# Patient Record
Sex: Female | Born: 1960 | Race: White | Hispanic: No | Marital: Single | Smoking: Never smoker
Health system: Southern US, Community
[De-identification: ages and names within clinical notes are randomized; demographics above are authoritative.]

## PROBLEM LIST (undated history)

## (undated) DIAGNOSIS — M1990 Unspecified osteoarthritis, unspecified site: Secondary | ICD-10-CM

## (undated) DIAGNOSIS — I10 Essential (primary) hypertension: Secondary | ICD-10-CM

## (undated) HISTORY — PX: ANKLE SURGERY: SHX546

## (undated) HISTORY — PX: WISDOM TOOTH EXTRACTION: SHX21

## (undated) HISTORY — PX: SPINAL FUSION: SHX223

## (undated) HISTORY — PX: JOINT REPLACEMENT: SHX530

## (undated) HISTORY — PX: BACK SURGERY: SHX140

## (undated) HISTORY — PX: ABDOMINAL HYSTERECTOMY: SHX81

## (undated) HISTORY — PX: TOTAL HIP ARTHROPLASTY: SHX124

## (undated) HISTORY — PX: STRABISMUS SURGERY: SHX218

---

## 2018-10-03 DIAGNOSIS — Z20828 Contact with and (suspected) exposure to other viral communicable diseases: Secondary | ICD-10-CM | POA: Diagnosis not present

## 2019-06-09 ENCOUNTER — Inpatient Hospital Stay (HOSPITAL_COMMUNITY)
Admission: EM | Admit: 2019-06-09 | Discharge: 2019-06-13 | DRG: 177 | Disposition: A | Discharge status: Home / Self Care | Payer: BC Managed Care – PPO | Attending: Internal Medicine | Admitting: Internal Medicine

## 2019-06-09 ENCOUNTER — Emergency Department (HOSPITAL_COMMUNITY): Payer: BC Managed Care – PPO

## 2019-06-09 ENCOUNTER — Other Ambulatory Visit: Payer: Self-pay

## 2019-06-09 ENCOUNTER — Encounter (HOSPITAL_COMMUNITY): Payer: Self-pay | Admitting: Emergency Medicine

## 2019-06-09 DIAGNOSIS — Z885 Allergy status to narcotic agent status: Secondary | ICD-10-CM

## 2019-06-09 DIAGNOSIS — R05 Cough: Secondary | ICD-10-CM

## 2019-06-09 DIAGNOSIS — Z6841 Body Mass Index (BMI) 40.0 and over, adult: Secondary | ICD-10-CM

## 2019-06-09 DIAGNOSIS — E875 Hyperkalemia: Secondary | ICD-10-CM | POA: Diagnosis not present

## 2019-06-09 DIAGNOSIS — R030 Elevated blood-pressure reading, without diagnosis of hypertension: Secondary | ICD-10-CM | POA: Diagnosis not present

## 2019-06-09 DIAGNOSIS — J9601 Acute respiratory failure with hypoxia: Secondary | ICD-10-CM | POA: Diagnosis not present

## 2019-06-09 DIAGNOSIS — Z96643 Presence of artificial hip joint, bilateral: Secondary | ICD-10-CM | POA: Diagnosis not present

## 2019-06-09 DIAGNOSIS — U071 COVID-19: Principal | ICD-10-CM | POA: Diagnosis present

## 2019-06-09 DIAGNOSIS — R7982 Elevated C-reactive protein (CRP): Secondary | ICD-10-CM | POA: Diagnosis not present

## 2019-06-09 DIAGNOSIS — Z833 Family history of diabetes mellitus: Secondary | ICD-10-CM | POA: Diagnosis not present

## 2019-06-09 DIAGNOSIS — Z9981 Dependence on supplemental oxygen: Secondary | ICD-10-CM | POA: Diagnosis not present

## 2019-06-09 DIAGNOSIS — Z03818 Encounter for observation for suspected exposure to other biological agents ruled out: Secondary | ICD-10-CM | POA: Diagnosis not present

## 2019-06-09 DIAGNOSIS — Z20828 Contact with and (suspected) exposure to other viral communicable diseases: Secondary | ICD-10-CM | POA: Diagnosis not present

## 2019-06-09 DIAGNOSIS — R059 Cough, unspecified: Secondary | ICD-10-CM

## 2019-06-09 DIAGNOSIS — M1990 Unspecified osteoarthritis, unspecified site: Secondary | ICD-10-CM | POA: Diagnosis not present

## 2019-06-09 DIAGNOSIS — R0902 Hypoxemia: Secondary | ICD-10-CM

## 2019-06-09 DIAGNOSIS — Z8249 Family history of ischemic heart disease and other diseases of the circulatory system: Secondary | ICD-10-CM

## 2019-06-09 HISTORY — DX: Unspecified osteoarthritis, unspecified site: M19.90

## 2019-06-09 LAB — COMPREHENSIVE METABOLIC PANEL
ALT: 38 U/L (ref 0–44)
AST: 48 U/L — ABNORMAL HIGH (ref 15–41)
Albumin: 3.4 g/dL — ABNORMAL LOW (ref 3.5–5.0)
Alkaline Phosphatase: 31 U/L — ABNORMAL LOW (ref 38–126)
Anion gap: 6 (ref 5–15)
BUN: 14 mg/dL (ref 6–20)
CO2: 28 mmol/L (ref 22–32)
Calcium: 8.4 mg/dL — ABNORMAL LOW (ref 8.9–10.3)
Chloride: 103 mmol/L (ref 98–111)
Creatinine, Ser: 0.54 mg/dL (ref 0.44–1.00)
GFR calc Af Amer: >60 mL/min (ref >60)
GFR calc non Af Amer: >60 mL/min (ref >60)
Glucose, Bld: 117 mg/dL — ABNORMAL HIGH (ref 70–99)
Potassium: 3.7 mmol/L (ref 3.5–5.1)
Sodium: 137 mmol/L (ref 135–145)
Total Bilirubin: 0.4 mg/dL (ref 0.3–1.2)
Total Protein: 6.5 g/dL (ref 6.5–8.1)

## 2019-06-09 LAB — CBC WITH DIFFERENTIAL/PLATELET
Abs Immature Granulocytes: 0.03 10*3/uL (ref 0.00–0.07)
Basophils Absolute: 0 10*3/uL (ref 0.0–0.1)
Basophils Relative: 0 %
Eosinophils Absolute: 0 10*3/uL (ref 0.0–0.5)
Eosinophils Relative: 0 %
HCT: 38.9 % (ref 36.0–46.0)
Hemoglobin: 12.2 g/dL (ref 12.0–15.0)
Immature Granulocytes: 1 %
Lymphocytes Relative: 16 %
Lymphs Abs: 1 10*3/uL (ref 0.7–4.0)
MCH: 28.8 pg (ref 26.0–34.0)
MCHC: 31.4 g/dL (ref 30.0–36.0)
MCV: 91.7 fL (ref 80.0–100.0)
Monocytes Absolute: 0.3 10*3/uL (ref 0.1–1.0)
Monocytes Relative: 5 %
Neutro Abs: 4.8 10*3/uL (ref 1.7–7.7)
Neutrophils Relative %: 78 %
Platelets: 196 10*3/uL (ref 150–400)
RBC: 4.24 MIL/uL (ref 3.87–5.11)
RDW: 14.2 % (ref 11.5–15.5)
WBC: 6.2 10*3/uL (ref 4.0–10.5)
nRBC: 0 % (ref 0.0–0.2)

## 2019-06-09 LAB — TRIGLYCERIDES: Triglycerides: 85 mg/dL (ref <150)

## 2019-06-09 LAB — RESPIRATORY PANEL BY RT PCR (FLU A&B, COVID)
Influenza A by PCR: NEGATIVE
Influenza B by PCR: NEGATIVE
SARS Coronavirus 2 by RT PCR: POSITIVE — AB

## 2019-06-09 LAB — LACTATE DEHYDROGENASE: LDH: 318 U/L — ABNORMAL HIGH (ref 98–192)

## 2019-06-09 LAB — LACTIC ACID, PLASMA: Lactic Acid, Venous: 0.7 mmol/L (ref 0.5–1.9)

## 2019-06-09 LAB — FERRITIN: Ferritin: 240 ng/mL (ref 11–307)

## 2019-06-09 LAB — HIV ANTIBODY (ROUTINE TESTING W REFLEX): HIV Screen 4th Generation wRfx: NONREACTIVE

## 2019-06-09 LAB — D-DIMER, QUANTITATIVE: D-Dimer, Quant: 1.19 ug/mL-FEU — ABNORMAL HIGH (ref 0.00–0.50)

## 2019-06-09 LAB — C-REACTIVE PROTEIN: CRP: 9 mg/dL — ABNORMAL HIGH (ref <1.0)

## 2019-06-09 LAB — FIBRINOGEN: Fibrinogen: 526 mg/dL — ABNORMAL HIGH (ref 210–475)

## 2019-06-09 LAB — PROCALCITONIN: Procalcitonin: <0.1 ng/mL

## 2019-06-09 MED ORDER — ZINC SULFATE 220 (50 ZN) MG PO CAPS
220.0000 mg | ORAL_CAPSULE | Freq: Every day | ORAL | Status: DC
  Administered 2019-06-09 – 2019-06-13 (×5): 220 mg via ORAL
  Filled 2019-06-09 (×5): qty 1

## 2019-06-09 MED ORDER — SODIUM CHLORIDE 0.9 % IV SOLN: 250.0000 mL | INTRAVENOUS | Status: DC | PRN

## 2019-06-09 MED ORDER — ENOXAPARIN SODIUM 40 MG/0.4ML ~~LOC~~ SOLN
40.0000 mg | Freq: Every day | SUBCUTANEOUS | Status: DC
  Administered 2019-06-10: 40 mg via SUBCUTANEOUS
  Filled 2019-06-09: qty 0.4

## 2019-06-09 MED ORDER — GUAIFENESIN-DM 100-10 MG/5ML PO SYRP: 10.0000 mL | ORAL_SOLUTION | ORAL | Status: DC | PRN

## 2019-06-09 MED ORDER — SODIUM CHLORIDE 0.9 % IV SOLN
100.0000 mg | Freq: Every day | INTRAVENOUS | Status: AC
  Administered 2019-06-10 – 2019-06-13 (×4): 100 mg via INTRAVENOUS
  Filled 2019-06-09: qty 100
  Filled 2019-06-09 (×2): qty 20
  Filled 2019-06-09: qty 100
  Filled 2019-06-09: qty 20

## 2019-06-09 MED ORDER — ACETAMINOPHEN 325 MG PO TABS
650.0000 mg | ORAL_TABLET | Freq: Four times a day (QID) | ORAL | Status: DC | PRN
  Administered 2019-06-10 – 2019-06-12 (×3): 650 mg via ORAL
  Filled 2019-06-09 (×4): qty 2

## 2019-06-09 MED ORDER — POLYETHYLENE GLYCOL 3350 17 G PO PACK: 17.0000 g | PACK | Freq: Every day | ORAL | Status: DC | PRN

## 2019-06-09 MED ORDER — ASCORBIC ACID 500 MG PO TABS
500.0000 mg | ORAL_TABLET | Freq: Every day | ORAL | Status: DC
  Administered 2019-06-09 – 2019-06-13 (×5): 500 mg via ORAL
  Filled 2019-06-09 (×5): qty 1

## 2019-06-09 MED ORDER — DEXAMETHASONE SODIUM PHOSPHATE 10 MG/ML IJ SOLN
6.0000 mg | INTRAMUSCULAR | Status: DC
  Administered 2019-06-10 – 2019-06-12 (×4): 6 mg via INTRAVENOUS
  Filled 2019-06-09 (×4): qty 1

## 2019-06-09 MED ORDER — SODIUM CHLORIDE 0.9 % IV SOLN
100.0000 mg | INTRAVENOUS | Status: AC
  Administered 2019-06-09 (×2): 100 mg via INTRAVENOUS
  Filled 2019-06-09: qty 20

## 2019-06-09 MED ORDER — ACETAMINOPHEN 325 MG PO TABS
650.0000 mg | ORAL_TABLET | Freq: Once | ORAL | Status: AC | PRN
  Administered 2019-06-09: 650 mg via ORAL
  Filled 2019-06-09: qty 2

## 2019-06-09 MED ORDER — SODIUM CHLORIDE 0.9 % IV SOLN: 200.0000 mg | Freq: Once | INTRAVENOUS | Status: DC

## 2019-06-09 MED ORDER — REMDESIVIR 100 MG IV SOLR
100.0000 mg | Freq: Every day | INTRAVENOUS | Status: DC
Start: 2019-06-10 — End: 2019-06-09

## 2019-06-09 MED ORDER — DEXAMETHASONE SODIUM PHOSPHATE 10 MG/ML IJ SOLN
8.0000 mg | Freq: Once | INTRAMUSCULAR | Status: AC
  Administered 2019-06-09: 8 mg via INTRAVENOUS
  Filled 2019-06-09: qty 1

## 2019-06-09 MED ORDER — ALBUTEROL SULFATE HFA 108 (90 BASE) MCG/ACT IN AERS
2.0000 | INHALATION_SPRAY | Freq: Four times a day (QID) | RESPIRATORY_TRACT | Status: DC
  Administered 2019-06-09 – 2019-06-13 (×14): 2 via RESPIRATORY_TRACT
  Filled 2019-06-09 (×2): qty 6.7

## 2019-06-09 MED ORDER — SODIUM CHLORIDE 0.9% FLUSH: 3.0000 mL | INTRAVENOUS | Status: DC | PRN

## 2019-06-09 MED ORDER — SODIUM CHLORIDE 0.9% FLUSH
3.0000 mL | Freq: Two times a day (BID) | INTRAVENOUS | Status: DC
  Administered 2019-06-09 – 2019-06-13 (×8): 3 mL via INTRAVENOUS

## 2019-06-09 NOTE — H&P (Signed)
History and Physical    Tracy Orozco TGG:[id number] DOB: Jun 12, 1960 DOA: 06/09/2019  PCP: Patient, No Pcp Per   I have briefly reviewed patients previous medical reports in [facility].  Patient coming from: Home  Chief Complaint: Progressive fatigue, headache and congestion, dry cough, some dyspnea.  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  Patient reports that approximately 10 days ago, associated with the weather change, she noted some headache, congestion consistent with prior episodes of sinusitis and attributed the symptoms to her allergies.  She had some initial cough with losing clear sputum.  She has never had fever, chills, body aches, loss of smell or taste.  3 to 4 days later, she started experiencing fatigue with ongoing headaches and the cough became more dry.  She attributed her symptoms to overwork which she has been doing because one of her colleagues has been out.  However her symptoms persisted, one of her acquaintances tested positive for COVID-19 and her sister encouraged her to get tested and hence she went to an urgent care where the test was positive and her oxygen saturations were 87%.  She indicates that having the facemask on makes it difficult to breathe at times and may have had some dyspnea but not particularly remarkable.  One of her students tested +3 days PTA.  She is yet to get the COVID-19 vaccine which she postponed  because of a cough that she had last month.  ED Course: Afebrile, transient mild tachypnea in the low 20s, transient mild tachycardia on arrival of 105, initial blood pressure of 170/86 which improved to 145/89.  Attempts by EDP to take her off the nasal cannula oxygen dropped her oxygen saturation to 88% and hence her oxygen was restarted.    Lab work: CMP unremarkable except for minimally elevated AST of 48, slightly low albumin of 3.1.  LDH 318.  Triglycerides, ferritin, lactate, procalcitonin all normal.  CRP 9.  CBC within normal limits.  D-dimer mildly elevated at 1.19.  Fibrinogen 526.  Blood cultures x2 pending.  Chest x-ray without acute abnormalities.  Flu panel and COVID-19 RT-PCR pending.  Review of Systems:  All other systems reviewed and apart from HPI, are negative.  Patient volunteers to taking Motrin every morning for arthritic pains and then she is able to get about her daily activities.  She has trouble lying supine for prolonged periods due to back pain.  Past Medical History:  Diagnosis Date  . Arthritis     Past Surgical History:  Procedure Laterality Date  . BACK SURGERY    . JOINT REPLACEMENT     Bilateral hip replacements.    Social History  reports that she does not drink alcohol or use drugs. No history on file for tobacco.  Allergies  Allergen Reactions  . Morphine And Related     "Heart pounds".    Family History  Problem Relation Age of Onset  . Hypertension Mother   . CAD Mother   . Hypertension Father   .  CAD Father   . Diabetes Brother      Prior to Admission medications   Not on File    Physical Exam: Vitals:   06/09/19 1353 06/09/19 1500 06/09/19 1530 06/09/19 1630  BP:  (!) 154/81 (!) 159/87 (!) 145/89  Pulse:  89 87 87  Resp:  (!) 25 (!) 22 (!) 22  Temp: 98.4 F (36.9 C)     TempSrc: Oral     SpO2:  93% 96% 94%      Constitutional: Pleasant middle-aged female, moderately built and obese lying comfortably propped up in bed  without distress. Eyes: PERTLA, lids and conjunctivae normal ENMT: Mucous membranes are moist. Posterior pharynx clear of any exudate or lesions. Normal dentition.  Neck: supple, no masses, no thyromegaly Respiratory: Slightly harsh breath sounds in the bases with few basal crackles but rest of lung fields clear to auscultation without wheezing or rhonchi.  No increased work of breathing.  Able to speak in full sentences. Cardiovascular: S1 & S2 heard, regular rate and rhythm. No JVD, murmurs, rubs or clicks. No pedal edema. Abdomen: Non distended. Non tender. Soft. No organomegaly or masses appreciated. No clinical Ascites. Normal bowel sounds heard. Musculoskeletal: no clubbing / cyanosis. No joint deformity upper and lower extremities. Good ROM, no contractures. Normal muscle tone.  Skin: no rashes, lesions, ulcers. No induration Neurologic: CN 2-12 grossly intact. Sensation intact, DTR normal. Strength 5/5 in all 4 limbs.  Psychiatric: Normal judgment and insight. Alert and oriented x 3. Normal mood.     Labs on Admission: I have personally reviewed following labs and imaging studies  CBC: Recent Labs  Lab 06/09/19 1357  WBC 6.2  NEUTROABS 4.8  HGB 12.2  HCT 38.9  MCV 91.7  PLT 196    Basic Metabolic Panel: Recent Labs  Lab 06/09/19 1357  NA 137  K 3.7  CL 103  CO2 28  GLUCOSE 117*  BUN 14  CREATININE 0.54  CALCIUM 8.4*    Liver Function Tests: Recent Labs  Lab 06/09/19 1357  AST 48*  ALT 38  ALKPHOS 31*  BILITOT 0.4  PROT 6.5  ALBUMIN 3.4*     Radiological Exams on Admission: DG Chest Port 1 View  Result Date: 06/09/2019 CLINICAL DATA:  Cough and fatigue with decreased oxygen saturation EXAM: PORTABLE CHEST 1 VIEW COMPARISON:  None. FINDINGS: Lungs are clear. Heart size and pulmonary vascularity are normal. No adenopathy. No bone lesions. IMPRESSION: Lungs clear.  Cardiac silhouette within normal limits. Electronically Signed   By: Bretta Bang III  M.D.   On: 06/09/2019 14:15    EKG: Independently reviewed.  Sinus rhythm at 96 bpm, normal axis, no acute changes.  QTc 424 ms.  Assessment/Plan Principal Problem:   COVID-19 virus infection Active Problems:   Acute respiratory failure with hypoxia (HCC)     Acute respiratory failure with hypoxia due to COVID-19 infection/likely pneumonia: Received a dose of IV Decadron in ED, continue same.  Follow COVID-19 RT-PCR results.  IV Remdesivir per pharmacy pending those results (discussed in detail with patient and she was agreeable).  Treat supportively with vitamin C, zinc sulfate, as needed albuterol inhaler, DVT prophylaxis with Lovenox.  Trend serial inflammatory markers.  Patient is relatively asymptomatic of dyspnea, hypoxia is mild requiring minimal oxygen.  If symptomatically gets worse or if hypoxia worsens requiring higher oxygen and inflammatory marker/CRP trends up then may need to consider Actemra.  Mildly elevated D-dimer likely due to COVID-19 infection, low index of suspicion  for VTE at this time but if this gets significantly elevated or symptoms worsen or becomes more hypoxic than will need to consider CTA chest.  Elevated blood pressures: No known diagnosis of hypertension.  Better than on initial arrival.  Monitor closely and if persistent, consider medications.  Low-salt diet.  Obesity     DVT prophylaxis: Lovenox Code Status: Full Family Communication: None at bedside Disposition Plan:   Patient is from:  Home  Anticipated DC to:  Home  Anticipated DC date:  Possibly in the next 48 hours  Anticipated DC barriers: Improvement in hypoxia related to COVID-19 infection.   Consults called: None Admission status: Inpatient/medical bed  Severity of Illness: The appropriate patient status for this patient is INPATIENT. Inpatient status is judged to be reasonable and necessary in order to provide the required intensity of service to ensure the patient's safety. The  patient's presenting symptoms, physical exam findings, and initial radiographic and laboratory data in the context of their chronic comorbidities is felt to place them at high risk for further clinical deterioration. Furthermore, it is not anticipated that the patient will be medically stable for discharge from the hospital within 2 midnights of admission. The following factors support the patient status of inpatient.   " The patient's presenting symptoms include progressive fatigue, dry cough, some dyspnea. " The worrisome physical exam findings include transient tachypnea and tachycardia.  Respiratory system with harsh breath sounds in the bases with basal crackles. " The initial radiographic and laboratory data are worrisome because of elevated CRP, D-dimer, chest x-ray negative. " The chronic co-morbidities include obesity.   * I certify that at the point of admission it is my clinical judgment that the patient will require inpatient hospital care spanning beyond 2 midnights from the point of admission due to high intensity of service, high risk for further deterioration and high frequency of surveillance required.Marcellus Scott MD [facility]  To contact the attending provider between 7A-7P or the covering provider during after hours 7P-7A, please log into the web site [email or web address] and access using universal [place] password for that web site. If you do not have the password, please call the hospital operator.  06/09/2019, 5:39 PM

## 2019-06-09 NOTE — ED Notes (Signed)
ED TO INPATIENT HANDOFF REPORT  ED Nurse Name and Phone #: jon wled   S Name/Age/Gender Tracy Orozco 59 y.o. female Room/Bed: WA24/WA24  Code Status   Code Status: Full Code  Home/SNF/Other Home Patient oriented to: self, place, time and situation Is this baseline? Yes   Triage Complete: Triage complete  Chief Complaint COVID-19 virus infection [U07.1]  Triage Note Per pt, states headache, fatigue, cough for 1 week-[facility]id [facility] came back positive-states oxygen low 85 % on RA so they sent her here    Allergies Allergies  Allergen Reactions  . Morphine And Related Other (See Comments)    "Heart pounds".    Level of Care/Admitting Diagnosis ED Disposition    ED Disposition Condition [facility]: [place] [100102]  Level of Care: Med-Surg [16]  May admit patient to [facility] or [facility] if equivalent level of care is available:: No  Covid Evaluation: Confirmed COVID Positive  Diagnosis: COVID-19 virus infection [[id number]]  Admitting Physician: Modena Jansky [[id number]]  Attending Physician: Vernell Leep D [[id number]]  Estimated length of stay: past midnight tomorrow  Certification:: I certify this patient will need inpatient services for at least 2 midnights       B Medical/Surgery History Past Medical History:  Diagnosis Date  . Arthritis    Past Surgical History:  Procedure Laterality Date  . BACK SURGERY    . JOINT REPLACEMENT     Bilateral hip replacements.     A IV Location/Drains/Wounds Patient Lines/Drains/Airways Status   Active Line/Drains/Airways    Name:   Placement date:   Placement time:   Site:   Days:   Peripheral IV 06/09/19 Right Antecubital   06/09/19    1420    Antecubital   less than 1   Peripheral IV 06/09/19 Left Hand   06/09/19    1430    Hand   less than 1          Intake/Output Last 24 hours No intake or output data in the 24 hours ending 06/09/19  2335  Labs/Imaging Results for orders placed or performed during the hospital encounter of 06/09/19 (from the past 48 hour(s))  Lactic acid, plasma     Status: None   Collection Time: 06/09/19  1:57 PM  Result Value Ref Range   Lactic Acid, Venous 0.7 0.5 - 1.9 mmol/L    Comment: Performed at [facility], [place] [address], [place], [place] [address]  CBC WITH DIFFERENTIAL     Status: None   Collection Time: 06/09/19  1:57 PM  Result Value Ref Range   WBC 6.2 4.0 - 10.5 K/uL   RBC 4.24 3.87 - 5.11 MIL/uL   Hemoglobin 12.2 12.0 - 15.0 g/dL   HCT 38.9 36.0 - 46.0 %   MCV 91.7 80.0 - 100.0 fL   MCH 28.8 26.0 - 34.0 pg   MCHC 31.4 30.0 - 36.0 g/dL   RDW 14.2 11.5 - 15.5 %   Platelets 196 150 - 400 K/uL   nRBC 0.0 0.0 - 0.2 %   Neutrophils Relative % 78 %   Neutro Abs 4.8 1.7 - 7.7 K/uL   Lymphocytes Relative 16 %   Lymphs Abs 1.0 0.7 - 4.0 K/uL   Monocytes Relative 5 %   Monocytes Absolute 0.3 0.1 - 1.0 K/uL   Eosinophils Relative 0 %   Eosinophils Absolute 0.0 0.0 - 0.5 K/uL   Basophils  Relative 0 %   Basophils Absolute 0.0 0.0 - 0.1 K/uL   Immature Granulocytes 1 %   Abs Immature Granulocytes 0.03 0.00 - 0.07 K/uL    Comment: Performed at [facility], [address] [place], [place] [address]  Comprehensive metabolic panel     Status: Abnormal   Collection Time: 06/09/19  1:57 PM  Result Value Ref Range   Sodium 137 135 - 145 mmol/L   Potassium 3.7 3.5 - 5.1 mmol/L   Chloride 103 98 - 111 mmol/L   CO2 28 22 - 32 mmol/L   Glucose, Bld 117 (H) 70 - 99 mg/dL    Comment: Glucose reference range applies only to samples taken after fasting for at least 8 hours.   BUN 14 6 - 20 mg/dL   Creatinine, Ser 0.16 0.44 - 1.00 mg/dL   Calcium 8.4 (L) 8.9 - 10.3 mg/dL   Total Protein 6.5 6.5 - 8.1 g/dL   Albumin 3.4 (L) 3.5 - 5.0 g/dL   AST 48 (H) 15 - 41 U/L   ALT 38 0 - 44 U/L   Alkaline Phosphatase 31 (L) 38 - 126 U/L   Total Bilirubin 0.4 0.3  - 1.2 mg/dL   GFR calc non Af Amer >60 >60 mL/min   GFR calc Af Amer >60 >60 mL/min   Anion gap 6 5 - 15    Comment: Performed at [facility], [address], [place], [place] [address]  D-dimer, quantitative     Status: Abnormal   Collection Time: 06/09/19  1:57 PM  Result Value Ref Range   D-Dimer, Quant 1.19 (H) 0.00 - 0.50 ug/mL-FEU    Comment: (NOTE) At the manufacturer cut-off of 0.50 ug/mL FEU, this assay has been documented to exclude PE with a sensitivity and negative predictive value of 97 to 99%.  At this time, this assay has not been approved by the FDA to exclude DVT/VTE. Results should be correlated with clinical presentation. Performed at [facility], [address] [place], [place] [address]   Procalcitonin     Status: None   Collection Time: 06/09/19  1:57 PM  Result Value Ref Range   Procalcitonin <0.10 ng/mL    Comment:        Interpretation: PCT (Procalcitonin) <= 0.5 ng/mL: Systemic infection (sepsis) is not likely. Local bacterial infection is possible. (NOTE)       Sepsis PCT Algorithm           Lower Respiratory Tract                                      Infection PCT Algorithm    ----------------------------     ----------------------------         PCT < 0.25 ng/mL                PCT < 0.10 ng/mL         Strongly encourage             Strongly discourage   discontinuation of antibiotics    initiation of antibiotics    ----------------------------     -----------------------------       PCT 0.25 - 0.50 ng/mL            PCT 0.10 - 0.25 ng/mL               OR       >  80% decrease in PCT            Discourage initiation of                                            antibiotics      Encourage discontinuation           of antibiotics    ----------------------------     -----------------------------         PCT >= 0.50 ng/mL              PCT 0.26 - 0.50 ng/mL               AND        <80% decrease in PCT              Encourage initiation of                                             antibiotics       Encourage continuation           of antibiotics    ----------------------------     -----------------------------        PCT >= 0.50 ng/mL                  PCT > 0.50 ng/mL               AND         increase in PCT                  Strongly encourage                                      initiation of antibiotics    Strongly encourage escalation           of antibiotics                                     -----------------------------                                           PCT <= 0.25 ng/mL                                                 OR                                        > 80% decrease in PCT                                     Discontinue / Do not initiate  antibiotics Performed at [facility], [address], [place], [place] [address]   Lactate dehydrogenase     Status: Abnormal   Collection Time: 06/09/19  1:57 PM  Result Value Ref Range   LDH 318 (H) 98 - 192 U/L    Comment: Performed at [facility], [address], [place], [place] [address]  Ferritin     Status: None   Collection Time: 06/09/19  1:57 PM  Result Value Ref Range   Ferritin 240 11 - 307 ng/mL    Comment: Performed at [facility], [address], [place], [place] [address]  Triglycerides     Status: None   Collection Time: 06/09/19  1:57 PM  Result Value Ref Range   Triglycerides 85 <150 mg/dL    Comment: Performed at [facility], [address] [place], [place] [address]  Fibrinogen     Status: Abnormal   Collection Time: 06/09/19  1:57 PM  Result Value Ref Range   Fibrinogen 526 (H) 210 - 475 mg/dL    Comment: Performed at [facility], [address], [place], [place] [address]  C-reactive protein     Status: Abnormal   Collection Time: 06/09/19  1:57 PM  Result Value  Ref Range   CRP 9.0 (H) <1.0 mg/dL    Comment: Performed at [facility], [address] [place], [place] [address]  Respiratory Panel by RT PCR (Flu A&B, Covid) - Nasopharyngeal Swab     Status: Abnormal   Collection Time: 06/09/19  5:40 PM   Specimen: Nasopharyngeal Swab  Result Value Ref Range   SARS Coronavirus 2 by RT PCR POSITIVE (A) NEGATIVE    Comment: RESULT CALLED TO, READ BACK BY AND VERIFIED WITH: J.Princella Ion [id number]  BY V.WILKINS (NOTE) SARS-CoV-2 target nucleic acids are DETECTED. SARS-CoV-2 RNA is generally detectable in upper respiratory specimens  during the acute phase of infection. Positive results are indicative of the presence of the identified virus, but do not rule out bacterial infection or co-infection with other pathogens not detected by the test. Clinical correlation with patient history and other diagnostic information is necessary to determine patient infection status. The expected result is Negative. Fact Sheet for Patients:  https://www.moore.com/ Fact Sheet for Healthcare Providers: https://www.young.biz/ This test is not yet approved or cleared by the [place] FDA and  has been authorized for detection and/or diagnosis of SARS-CoV-2 by FDA under an Emergency Use Authorization (EUA).  This EUA will remain in effect (meaning this test can be u sed) for the duration of  the COVID-19 declaration under Section 564(b)(1) of the Act, 21 U.S.C. section 360bbb-3(b)(1), unless the authorization is terminated or revoked sooner.    Influenza A by PCR NEGATIVE NEGATIVE   Influenza B by PCR NEGATIVE NEGATIVE    Comment: (NOTE) The Xpert Xpress SARS-CoV-2/FLU/RSV assay is intended as an aid in  the diagnosis of influenza from Nasopharyngeal swab specimens and  should not be used as a sole basis for treatment. Nasal washings and  aspirates are unacceptable for Xpert Xpress SARS-CoV-2/FLU/RSV   testing. Fact Sheet for Patients: https://www.moore.com/ Fact Sheet for Healthcare Providers: https://www.young.biz/ This test is not yet approved or cleared by the [place] FDA and  has been authorized for detection and/or diagnosis of SARS-CoV-2 by  FDA under an Emergency Use Authorization (EUA). This EUA will remain  in effect (meaning this test can be used) for the duration of the  Covid-19  declaration under Section 564(b)(1) of the Act, 21  U.S.C. section 360bbb-3(b)(1), unless the authorization is  terminated or revoked. Performed at [facility], [address] [place], [place] [address]   HIV Antibody (routine testing w rflx)     Status: None   Collection Time: 06/09/19  5:48 PM  Result Value Ref Range   HIV Screen 4th Generation wRfx Non Reactive Non Reactive    Comment: Performed at [facility], [address] [place], [place] [address]   DG Chest Port 1 View  Result Date: 06/09/2019 CLINICAL DATA:  Cough and fatigue with decreased oxygen saturation EXAM: PORTABLE CHEST 1 VIEW COMPARISON:  None. FINDINGS: Lungs are clear. Heart size and pulmonary vascularity are normal. No adenopathy. No bone lesions. IMPRESSION: Lungs clear.  Cardiac silhouette within normal limits. Electronically Signed   By: Bretta BangWilliam  Woodruff III M.D.   On: 06/09/2019 14:15    Pending Labs Unresulted Labs (From admission, onward)    Start     Ordered   06/16/19 0500  Creatinine, serum  (enoxaparin (LOVENOX)    CrCl >/= 30 ml/min)  Weekly,   R    Comments: while on enoxaparin therapy    06/09/19 1747   06/10/19 0500  CBC with Differential/Platelet  Daily,   R     06/09/19 1747   06/10/19 0500  Comprehensive metabolic panel  Daily,   R     06/09/19 1747   06/10/19 0500  C-reactive protein  Daily,   R     06/09/19 1747   06/10/19 0500  D-dimer, quantitative (not at [facility])  Daily,   R     06/09/19 1747   06/10/19 0500  Ferritin   Daily,   R     06/09/19 1747   06/10/19 0500  Magnesium  Daily,   R     06/09/19 1747   06/10/19 0500  Phosphorus  Daily,   R     06/09/19 1747   06/09/19 1748  ABO/Rh  Once,   STAT     06/09/19 1747   06/09/19 1357  SARS CORONAVIRUS 2 (TAT 6-24 HRS) Nasopharyngeal Nasopharyngeal Swab  (Novel Coronavirus, [facility](Hospital Order))  Once,   STAT    Question Answer Comment  Is this test for diagnosis or screening Diagnosis of ill patient   Symptomatic for COVID-19 as defined by CDC Yes   Date of Symptom Onset 06/02/2019   Hospitalized for COVID-19 No   Admitted to ICU for COVID-19 No   Previously tested for COVID-19 Yes   Resident in a congregate (group) care setting No   Employed in healthcare setting No   Pregnant No   Has patient completed COVID vaccination(s) (2 doses of Pfizer/Moderna 1 dose of [employer]) No      06/09/19 1357   06/09/19 1357  Blood Culture (routine x 2)  BLOOD CULTURE X 2,   STAT     06/09/19 1357          Vitals/Pain Today's Vitals   06/09/19 2145 06/09/19 2230 06/09/19 2300 06/09/19 2330  BP:  (!) 162/93 (!) 168/94 (!) 164/79  Pulse:  78 74 72  Resp:  (!) 21 (!) 23 18  Temp:      TempSrc:      SpO2:  96% 96% 93%  Weight: 122.5 kg     Height: 5\' 6"  (1.676 m)     PainSc:        Isolation Precautions Airborne and  Contact precautions  Medications Medications  enoxaparin (LOVENOX) injection 40 mg (has no administration in time range)  sodium chloride flush (NS) 0.9 % injection 3 mL (3 mLs Intravenous Given 06/09/19 2215)  sodium chloride flush (NS) 0.9 % injection 3 mL (has no administration in time range)  0.9 %  sodium chloride infusion (has no administration in time range)  albuterol (VENTOLIN HFA) 108 (90 Base) MCG/ACT inhaler 2 puff (2 puffs Inhalation Given 06/09/19 2142)  dexamethasone (DECADRON) injection 6 mg (has no administration in time range)  guaiFENesin-dextromethorphan (ROBITUSSIN DM) 100-10 MG/5ML syrup 10 mL (has no  administration in time range)  ascorbic acid (VITAMIN C) tablet 500 mg (500 mg Oral Given 06/09/19 1819)  zinc sulfate capsule 220 mg (220 mg Oral Given 06/09/19 1819)  acetaminophen (TYLENOL) tablet 650 mg (has no administration in time range)  polyethylene glycol (MIRALAX / GLYCOLAX) packet 17 g (has no administration in time range)  remdesivir 100 mg in sodium chloride 0.9 % 100 mL IVPB (has no administration in time range)  acetaminophen (TYLENOL) tablet 650 mg (650 mg Oral Given 06/09/19 1522)  dexamethasone (DECADRON) injection 8 mg (8 mg Intravenous Given 06/09/19 1522)  remdesivir 100 mg in sodium chloride 0.9 % 100 mL IVPB (0 mg Intravenous Stopped 06/09/19 2334)    Mobility walks Low fall risk   Focused Assessments Pulmonary Assessment Handoff:  Lung sounds: Bilateral Breath Sounds: Diminished O2 Device: Nasal Cannula O2 Flow Rate (L/min): 2 L/min      R Recommendations: See Admitting Provider Note  Report given to:   Additional Notes:

## 2019-06-09 NOTE — ED Triage Notes (Signed)
Per pt, states headache, fatigue, cough for 1 week-[facility]-rapid test came back positive-states oxygen low 85 % on RA so they sent her here

## 2019-06-09 NOTE — Progress Notes (Signed)
Call received from ED nurse that pt was on the way, informed them that this RN had not received report yet. RN stated she was not giving report and pt was on the way.

## 2019-06-09 NOTE — ED Provider Notes (Signed)
[facility]-EMERGENCY DEPT Provider Note   CSN: [id number] Arrival date & time: 06/09/19  1310     History Chief Complaint  Patient presents with  . covid positive    Tracy Orozco is a 59 y.o. female with no significant past medical history who presents to the ED with hypoxia in setting of COVID-19 infection.  Patient reports that approximately 8 days ago on 06/01/2019 she developed sinus congestion, sinus headache, and a cough productive of clearish yellow mucus.  She states she suspected seasonal allergies and proceeded to take Sudafed and antihistamines.  She reports that her symptoms continue to persist and that her fatigue progressively worsened.  She also endorses diminished appetite.  Since her symptoms fail to improve, she went to an urgent care in [place], [place] for COVID-19 testing this morning.  She was positive with the point-of-care antigen testing and found to be hypoxic in the upper 80s and advised to come to the ED for comprehensive work-up.  She is surprised by today's findings as she does not feel particularly short of breath.  Patient denies any fevers at home, but endorses intermittent chills.  She also denies any nausea symptoms, but feels "queasy".  She denies any dizziness, blurred vision, chest pain, abdominal pain, emesis, urinary symptoms, or changes in bowel habits.  No history of clots, no recent surgery or immobilization.  HPI     History reviewed. No pertinent past medical history.  There are no problems to display for this patient.   History reviewed. No pertinent surgical history.   OB History   No obstetric history on file.     No family history on file.  Social History   Tobacco Use  . Smoking status: Not on file  Substance Use Topics  . Alcohol use: Never  . Drug use: Never    Home Medications Prior to Admission medications   Not on File    Allergies    Patient has no allergy information on record.  Review of  Systems   Review of Systems  All other systems reviewed and are negative.   Physical Exam Updated Vital Signs BP (!) 170/86 (BP Location: Left Arm)   Pulse (!) 105   Temp 98.4 F (36.9 C) (Oral)   Resp 20   SpO2 96%   Physical Exam Vitals and nursing note reviewed. Exam conducted with a chaperone present.  Constitutional:      Appearance: She is ill-appearing.  HENT:     Head: Normocephalic and atraumatic.  Eyes:     General: No scleral icterus.    Conjunctiva/sclera: Conjunctivae normal.  Cardiovascular:     Rate and Rhythm: Normal rate and regular rhythm.     Pulses: Normal pulses.     Heart sounds: Normal heart sounds.  Pulmonary:     Comments: No significant increased work of breathing.  Mildly tachypneic.  No abnormal breath sounds.  No accessory muscle use.  No acute distress. Abdominal:     General: Abdomen is flat. There is no distension.     Palpations: Abdomen is soft.     Tenderness: There is no abdominal tenderness. There is no guarding.  Musculoskeletal:     Cervical back: Normal range of motion. No rigidity.     Right lower leg: No edema.     Left lower leg: No edema.  Skin:    General: Skin is dry.     Capillary Refill: Capillary refill takes less than 2 seconds.  Neurological:  Mental Status: She is alert and oriented to person, place, and time.     GCS: GCS eye subscore is 4. GCS verbal subscore is 5. GCS motor subscore is 6.  Psychiatric:        Mood and Affect: Mood normal.        Behavior: Behavior normal.        Thought Content: Thought content normal.     ED Results / Procedures / Treatments   Labs (all labs ordered are listed, but only abnormal results are displayed) Labs Reviewed  SARS CORONAVIRUS 2 (TAT 6-24 HRS)  CULTURE, BLOOD (ROUTINE X 2)  CULTURE, BLOOD (ROUTINE X 2)  CBC WITH DIFFERENTIAL/PLATELET  LACTIC ACID, PLASMA  LACTIC ACID, PLASMA  COMPREHENSIVE METABOLIC PANEL  D-DIMER, QUANTITATIVE (NOT AT [facility])  PROCALCITONIN    LACTATE DEHYDROGENASE  FERRITIN  TRIGLYCERIDES  FIBRINOGEN  C-REACTIVE PROTEIN    EKG None  Radiology DG Chest Port 1 View  Result Date: 06/09/2019 CLINICAL DATA:  Cough and fatigue with decreased oxygen saturation EXAM: PORTABLE CHEST 1 VIEW COMPARISON:  None. FINDINGS: Lungs are clear. Heart size and pulmonary vascularity are normal. No adenopathy. No bone lesions. IMPRESSION: Lungs clear.  Cardiac silhouette within normal limits. Electronically Signed   By: Lowella Grip III M.D.   On: 06/09/2019 14:15    Procedures Procedures (including critical care time)  Medications Ordered in ED Medications  acetaminophen (TYLENOL) tablet 650 mg (650 mg Oral Given 06/09/19 1522)  dexamethasone (DECADRON) injection 8 mg (8 mg Intravenous Given 06/09/19 1522)    ED Course  I have reviewed the triage vital signs and the nursing notes.  Pertinent labs & imaging results that were available during my care of the patient were reviewed by me and considered in my medical decision making (see chart for details).    MDM Rules/Calculators/A&P                      Patient was point-of-care antigen COVID-19 tested this morning and found to be positive.  She was also hypoxic and encouraged to come to the ED for evaluation.  Since being placed on 2 L O2, she has been saturating quite well in the 90s.  On my initial examination, I took her off of her 2 L supplemental O2 briefly and she dipped to 88% on RA before I subsequently restarted her on oxygen.  She does not appear to be in any acute respiratory distress and is speaking in full sentences.  She is resting comfortably and frustrated that she was not tested for COVID-19 today.  However, she truly but this was related to her history of sinus infections and seasonal allergies.  I personally reviewed CT chest which demonstrates no obvious evidence of developing infiltrates concerning for COVID-19 pneumonia.  Discussed with Dr. Selinda Eon.  Plan is to provide  8 mg IV Decadron and hold on remdesivir until patient is admitted by hospitalist for new onset oxygen requirement in the setting of COVID-19.  Will consider CTA chest if patient's D-Dimer significantly elevated.  At shift change care was transferred to Suella Broad, PA-C who will follow pending studies, re-evaluate, and determine disposition.      Final Clinical Impression(s) / ED Diagnoses Final diagnoses:  COVID-19  Hypoxia    Rx / DC Orders ED Discharge Orders    None       Corena Herter, PA-C 06/09/19 1524    Long, Wonda Olds, MD 06/10/19 [phone]

## 2019-06-09 NOTE — Progress Notes (Signed)
PT demonstrated ability to perform Flutter device.

## 2019-06-09 NOTE — ED Provider Notes (Signed)
59yo female with cough x 1 week, SHOB with sats 85%, +COVID at [facility] sent to ER. Sats 96% on 2L [place]. Given decadron. Able to speak in complete sentences.  Physical Exam  BP (!) 170/86 (BP Location: Left Arm)   Pulse (!) 105   Temp 98.4 F (36.9 C) (Oral)   Resp 20   SpO2 96%   Physical Exam  ED Course/Procedures     Procedures  MDM  Plan is to admit when labs result. Case discussed with Dr. Roselee Nova with [facility] who will consult for admission.        Jeannie Fend, PA-C 06/09/19 1632    Maia Plan, MD 06/10/19 [phone]

## 2019-06-09 NOTE — Progress Notes (Signed)
Received pt. Ready for transfer to floor, awaiting for previous Nurse ,John to give report to receiving Nurse on floor. Charge nurse Leta Jungling made aware.

## 2019-06-10 ENCOUNTER — Encounter (HOSPITAL_COMMUNITY): Payer: Self-pay | Admitting: Internal Medicine

## 2019-06-10 LAB — CBC WITH DIFFERENTIAL/PLATELET
Abs Immature Granulocytes: 0.06 10*3/uL (ref 0.00–0.07)
Basophils Absolute: 0 10*3/uL (ref 0.0–0.1)
Basophils Relative: 0 %
Eosinophils Absolute: 0 10*3/uL (ref 0.0–0.5)
Eosinophils Relative: 0 %
HCT: 39.9 % (ref 36.0–46.0)
Hemoglobin: 12.6 g/dL (ref 12.0–15.0)
Immature Granulocytes: 1 %
Lymphocytes Relative: 14 %
Lymphs Abs: 0.7 10*3/uL (ref 0.7–4.0)
MCH: 28.6 pg (ref 26.0–34.0)
MCHC: 31.6 g/dL (ref 30.0–36.0)
MCV: 90.5 fL (ref 80.0–100.0)
Monocytes Absolute: 0.3 10*3/uL (ref 0.1–1.0)
Monocytes Relative: 6 %
Neutro Abs: 4 10*3/uL (ref 1.7–7.7)
Neutrophils Relative %: 79 %
Platelets: 203 10*3/uL (ref 150–400)
RBC: 4.41 MIL/uL (ref 3.87–5.11)
RDW: 13.8 % (ref 11.5–15.5)
WBC: 5.1 10*3/uL (ref 4.0–10.5)
nRBC: 0 % (ref 0.0–0.2)

## 2019-06-10 LAB — BLOOD CULTURE ID PANEL (REFLEXED)

## 2019-06-10 LAB — COMPREHENSIVE METABOLIC PANEL
ALT: 36 U/L (ref 0–44)
AST: 38 U/L (ref 15–41)
Albumin: 3.3 g/dL — ABNORMAL LOW (ref 3.5–5.0)
Alkaline Phosphatase: 29 U/L — ABNORMAL LOW (ref 38–126)
Anion gap: 8 (ref 5–15)
BUN: 15 mg/dL (ref 6–20)
CO2: 28 mmol/L (ref 22–32)
Calcium: 8.9 mg/dL (ref 8.9–10.3)
Chloride: 104 mmol/L (ref 98–111)
Creatinine, Ser: 0.59 mg/dL (ref 0.44–1.00)
GFR calc Af Amer: >60 mL/min (ref >60)
GFR calc non Af Amer: >60 mL/min (ref >60)
Glucose, Bld: 170 mg/dL — ABNORMAL HIGH (ref 70–99)
Potassium: 4.2 mmol/L (ref 3.5–5.1)
Sodium: 140 mmol/L (ref 135–145)
Total Bilirubin: 0.4 mg/dL (ref 0.3–1.2)
Total Protein: 6.5 g/dL (ref 6.5–8.1)

## 2019-06-10 LAB — PHOSPHORUS: Phosphorus: 2.3 mg/dL — ABNORMAL LOW (ref 2.5–4.6)

## 2019-06-10 LAB — D-DIMER, QUANTITATIVE: D-Dimer, Quant: 1.06 ug/mL-FEU — ABNORMAL HIGH (ref 0.00–0.50)

## 2019-06-10 LAB — FERRITIN: Ferritin: 222 ng/mL (ref 11–307)

## 2019-06-10 LAB — MAGNESIUM: Magnesium: 2 mg/dL (ref 1.7–2.4)

## 2019-06-10 LAB — C-REACTIVE PROTEIN: CRP: 8.5 mg/dL — ABNORMAL HIGH (ref <1.0)

## 2019-06-10 LAB — SARS CORONAVIRUS 2 (TAT 6-24 HRS): SARS Coronavirus 2: POSITIVE — AB

## 2019-06-10 LAB — ABO/RH: ABO/RH(D): O POS

## 2019-06-10 MED ORDER — K PHOS MONO-SOD PHOS DI & MONO 155-852-130 MG PO TABS
500.0000 mg | ORAL_TABLET | Freq: Three times a day (TID) | ORAL | Status: AC
  Administered 2019-06-10 (×3): 500 mg via ORAL
  Filled 2019-06-10 (×3): qty 2

## 2019-06-10 MED ORDER — ENOXAPARIN SODIUM 60 MG/0.6ML ~~LOC~~ SOLN
60.0000 mg | Freq: Every day | SUBCUTANEOUS | Status: DC
  Administered 2019-06-10 – 2019-06-12 (×3): 60 mg via SUBCUTANEOUS
  Filled 2019-06-10 (×3): qty 0.6

## 2019-06-10 NOTE — Progress Notes (Signed)
PROGRESS NOTE    Tracy Orozco  FTD:[id number] DOB: 09/10/1960 DOA: 06/09/2019 PCP: Patient, No Pcp Per   Brief Narrative: Patient is a 59 year old female with no significant past medical history except for seasonal allergies who presented to the emergency department on 5/21 with complaints of progressive headache, headache, congestion, dry cough, shortness of breath.  She was diagnosed with Covid at urgent care at [facility].  On presentation she was afebrile, mildly tachypneic, tachycardic, hypertensive.  Her saturations were 88% on room air.  Lab work showed minimal elevated liver enzyme, elevated CRP, fibrinogen.  Chest x-ray did not show pneumonia.  Was admitted for the management of Covid illness and was admitted because she was hypoxic on room air.  Started on Decadron and remdesivir.  Assessment & Plan:   Principal Problem:   COVID-19 virus infection Active Problems:   Acute respiratory failure with hypoxia (HCC)   Acute respiratory failure with hypoxia: Due to Covid illness.  Currently on 1 L of oxygen per minute.  We will try to wean the oxygen.  Covid infection: Chest x-ray on presentation did not show any pneumonia.  She was hypoxic on room air and currently on 1 L of oxygen per minute.  Continue to monitor inflammatory markers.  Started on Decadron, remdesivir, day 2.  Continue vitamin C, zinc.  DVT prophylaxis with Lovenox.   Elevated blood pressure: No history of hypertension: Remains hypertensive today.  Monitor closely.  We will consider antihypertensive if she continues to be hypotensive  Obesity: BMI of 43.5.         DVT prophylaxis:Lovenox Code Status: Full Family Communication:  Status is: Inpatient  Remains inpatient appropriate because:IV treatments appropriate due to intensity of illness or inability to take PO   Dispo: The patient is from: Home              Anticipated d/c is to: Home              Anticipated d/c date is: 2 days              Patient  currently is not medically stable to d/c.  Currently on IV remdesivir, requiring oxygen supplementation.         Consultants: None  Procedures: None  Antimicrobials:  Anti-infectives (From admission, onward)   Start     Dose/Rate Route Frequency Ordered Stop   06/10/19 1000  remdesivir 100 mg in sodium chloride 0.9 % 100 mL IVPB  Status:  Discontinued     100 mg 200 mL/hr over 30 Minutes Intravenous Daily 06/09/19 1747 06/09/19 1753   06/10/19 1000  remdesivir 100 mg in sodium chloride 0.9 % 100 mL IVPB     100 mg 200 mL/hr over 30 Minutes Intravenous Daily 06/09/19 1755 06/14/19 0959   06/09/19 1800  remdesivir 100 mg in sodium chloride 0.9 % 100 mL IVPB     100 mg 200 mL/hr over 30 Minutes Intravenous Every 1 hr x 2 06/09/19 1755 06/09/19 2334   06/09/19 1747  remdesivir 200 mg in sodium chloride 0.9% 250 mL IVPB  Status:  Discontinued    Note to Pharmacy: Kindly confirm COVID-19 RT-PCR positive status prior to initiation.   200 mg 580 mL/hr over 30 Minutes Intravenous Once 06/09/19 1747 06/09/19 1753      Subjective: Patient seen and examined the bedside this morning. Discharge bed comfortable.  Sitting on the chair.  Denies any shortness of breath on rest.  On 1 L of oxygen per minute.  Objective: Vitals:   06/09/19 2330 06/09/19 2356 06/10/19 0424 06/10/19 0827  BP: (!) 164/79 (!) 145/77 139/79 (!) 150/85  Pulse: 72 81 78 82  Resp: 18 18 18    Temp:  97.6 F (36.4 C) 98.5 F (36.9 C) 97.8 F (36.6 C)  TempSrc:  Oral Oral Oral  SpO2: 93% 97% 97% 90%  Weight:      Height:        Intake/Output Summary (Last 24 hours) at 06/10/2019 0858 Last data filed at 06/10/2019 0300 Gross per 24 hour  Intake 186.48 ml  Output --  Net 186.48 ml   Filed Weights   06/09/19 2145  Weight: 122.5 kg    Examination:  General exam: Appears calm and comfortable ,Not in distress, morbidly obese  HEENT:PERRL,Oral mucosa moist, Ear/Nose normal on gross exam Respiratory system:  Bilateral equal air entry, normal vesicular breath sounds, no wheezes or crackles  Cardiovascular system: S1 & S2 heard, RRR. No JVD, murmurs, rubs, gallops or clicks. No pedal edema. Gastrointestinal system: Abdomen is nondistended, soft and nontender. No organomegaly or masses felt. Normal bowel sounds heard. Central nervous system: Alert and oriented. No focal neurological deficits. Extremities: No edema, no clubbing ,no cyanosis, distal peripheral pulses palpable.  Data Reviewed: I have personally reviewed following labs and imaging studies  CBC: Recent Labs  Lab 06/09/19 1357 06/10/19 0500  WBC 6.2 5.1  NEUTROABS 4.8 4.0  HGB 12.2 12.6  HCT 38.9 39.9  MCV 91.7 90.5  PLT 196 203   Basic Metabolic Panel: Recent Labs  Lab 06/09/19 1357 06/10/19 0500  NA 137 140  K 3.7 4.2  CL 103 104  CO2 28 28  GLUCOSE 117* 170*  BUN 14 15  CREATININE 0.54 0.59  CALCIUM 8.4* 8.9  MG  --  2.0  PHOS  --  2.3*   GFR: Estimated Creatinine Clearance: 101.1 mL/min (by C-G formula based on SCr of 0.59 mg/dL). Liver Function Tests: Recent Labs  Lab 06/09/19 1357 06/10/19 0500  AST 48* 38  ALT 38 36  ALKPHOS 31* 29*  BILITOT 0.4 0.4  PROT 6.5 6.5  ALBUMIN 3.4* 3.3*   No results for input(s): LIPASE, AMYLASE in the last 168 hours. No results for input(s): AMMONIA in the last 168 hours. Coagulation Profile: No results for input(s): INR, PROTIME in the last 168 hours. Cardiac Enzymes: No results for input(s): CKTOTAL, CKMB, CKMBINDEX, TROPONINI in the last 168 hours. BNP (last 3 results) No results for input(s): PROBNP in the last 8760 hours. HbA1C: No results for input(s): HGBA1C in the last 72 hours. CBG: No results for input(s): GLUCAP in the last 168 hours. Lipid Profile: Recent Labs    06/09/19 1357  TRIG 85   Thyroid Function Tests: No results for input(s): TSH, T4TOTAL, FREET4, T3FREE, THYROIDAB in the last 72 hours. Anemia Panel: Recent Labs    06/09/19 1357  06/10/19 0500  FERRITIN 240 222   Sepsis Labs: Recent Labs  Lab 06/09/19 1357  PROCALCITON <0.10  LATICACIDVEN 0.7    Recent Results (from the past 240 hour(s))  SARS CORONAVIRUS 2 (TAT 6-24 HRS) Nasopharyngeal Nasopharyngeal Swab     Status: Abnormal   Collection Time: 06/09/19  1:57 PM   Specimen: Nasopharyngeal Swab  Result Value Ref Range Status   SARS Coronavirus 2 POSITIVE (A) NEGATIVE Final    Comment: RESULT CALLED TO, READ BACK BY AND VERIFIED WITH: MICHUDA K, RN AT 0052 ON 06/10/2019 BY SAINVILUS S (NOTE) SARS-CoV-2 target nucleic acids are  DETECTED. The SARS-CoV-2 RNA is generally detectable in upper and lower respiratory specimens during the acute phase of infection. Positive results are indicative of the presence of SARS-CoV-2 RNA. Clinical correlation with patient history and other diagnostic information is  necessary to determine patient infection status. Positive results do not rule out bacterial infection or co-infection with other viruses.  The expected result is Negative. Fact Sheet for Patients: HairSlick.no Fact Sheet for Healthcare Providers: quierodirigir.com This test is not yet approved or cleared by the [place] FDA and  has been authorized for detection and/or diagnosis of SARS-CoV-2 by FDA under an Emergency Use Authorization (EUA). This EUA will remain  in effect (meaning this test can b e used) for the duration of the COVID-19 declaration under Section 564(b)(1) of the Act, 21 U.S.C. section 360bbb-3(b)(1), unless the authorization is terminated or revoked sooner. Performed at [facility], [address] [place], [place] [address]   Respiratory Panel by RT PCR (Flu A&B, Covid) - Nasopharyngeal Swab     Status: Abnormal   Collection Time: 06/09/19  5:40 PM   Specimen: Nasopharyngeal Swab  Result Value Ref Range Status   SARS Coronavirus 2 by RT PCR POSITIVE (A) NEGATIVE Final      Comment: RESULT CALLED TO, READ BACK BY AND VERIFIED WITH: J.Princella Ion [id number] @2201  BY V.WILKINS (NOTE) SARS-CoV-2 target nucleic acids are DETECTED. SARS-CoV-2 RNA is generally detectable in upper respiratory specimens  during the acute phase of infection. Positive results are indicative of the presence of the identified virus, but do not rule out bacterial infection or co-infection with other pathogens not detected by the test. Clinical correlation with patient history and other diagnostic information is necessary to determine patient infection status. The expected result is Negative. Fact Sheet for Patients:  Fact Sheet for Healthcare Providers: https://www.moore.com/ This test is not yet approved or cleared by the https://www.young.biz/ FDA and  has been authorized for detection and/or diagnosis of SARS-CoV-2 by FDA under an Emergency Use Authorization (EUA).  This EUA will remain in effect (meaning this test can be u sed) for the duration of  the COVID-19 declaration under Section 564(b)(1) of the Act, 21 U.S.C. section 360bbb-3(b)(1), unless the authorization is terminated or revoked sooner.    Influenza A by PCR NEGATIVE NEGATIVE Final   Influenza B by PCR NEGATIVE NEGATIVE Final    Comment: (NOTE) The Xpert Xpress SARS-CoV-2/FLU/RSV assay is intended as an aid in  the diagnosis of influenza from Nasopharyngeal swab specimens and  should not be used as a sole basis for treatment. Nasal washings and  aspirates are unacceptable for Xpert Xpress SARS-CoV-2/FLU/RSV  testing. Fact Sheet for Patients: [place] Fact Sheet for Healthcare Providers: https://www.moore.com/ This test is not yet approved or cleared by the https://www.young.biz/ FDA and  has been authorized for detection and/or diagnosis of SARS-CoV-2 by  FDA under an Emergency Use Authorization (EUA). This EUA will  remain  in effect (meaning this test can be used) for the duration of the  Covid-19 declaration under Section 564(b)(1) of the Act, 21  U.S.C. section 360bbb-3(b)(1), unless the authorization is  terminated or revoked. Performed at [facility], [address] [place], [place]          Radiology Studies: [facility] Chest Port 1 View  Result Date: 06/09/2019 CLINICAL DATA:  Cough and fatigue with decreased oxygen saturation EXAM: PORTABLE CHEST 1 VIEW COMPARISON:  None. FINDINGS: Lungs are clear. Heart size and pulmonary vascularity  are normal. No adenopathy. No bone lesions. IMPRESSION: Lungs clear.  Cardiac silhouette within normal limits. Electronically Signed   By: Bretta Bang III M.D.   On: 06/09/2019 14:15        Scheduled Meds: . albuterol  2 puff Inhalation Q6H  . vitamin C  500 mg Oral Daily  . dexamethasone (DECADRON) injection  6 mg Intravenous Q24H  . enoxaparin (LOVENOX) injection  60 mg Subcutaneous QHS  . phosphorus  500 mg Oral TID  . sodium chloride flush  3 mL Intravenous Q12H  . zinc sulfate  220 mg Oral Daily   Continuous Infusions: . sodium chloride    . remdesivir 100 mg in NS 100 mL       LOS: 1 day    Time spent: More than 50% of that time was spent in counseling and/or coordination of care.      Burnadette Pop, MD [facility] P5/10/2019, 8:58 AM

## 2019-06-10 NOTE — Progress Notes (Signed)
PHARMACY - PHYSICIAN COMMUNICATION CRITICAL VALUE ALERT - BLOOD CULTURE IDENTIFICATION (BCID)  Tracy Orozco is an 59 y.o. female who presented to [facility] on 06/09/2019 with a chief complaint of Progressive fatigue, headache and congestion, dry cough, some dyspnea  Assessment:  COVID-19 positive  Name of physician (or Provider) Contacted: Dr. Renford Dills  Current antibiotics: none  Changes to prescribed antibiotics recommended:  Likely contaminant as 1/4 bottles, no abx currently indicated.   Results for orders placed or performed during the hospital encounter of 06/09/19  Blood Culture ID Panel (Reflexed) (Collected: 06/09/2019  1:57 PM)  Result Value Ref Range   Enterococcus species NOT DETECTED NOT DETECTED   Listeria monocytogenes NOT DETECTED NOT DETECTED   Staphylococcus species DETECTED (A) NOT DETECTED   Staphylococcus aureus (BCID) NOT DETECTED NOT DETECTED   Methicillin resistance NOT DETECTED NOT DETECTED   Streptococcus species NOT DETECTED NOT DETECTED   Streptococcus agalactiae NOT DETECTED NOT DETECTED   Streptococcus pneumoniae NOT DETECTED NOT DETECTED   Streptococcus pyogenes NOT DETECTED NOT DETECTED   Acinetobacter baumannii NOT DETECTED NOT DETECTED   Enterobacteriaceae species NOT DETECTED NOT DETECTED   Enterobacter cloacae complex NOT DETECTED NOT DETECTED   Escherichia coli NOT DETECTED NOT DETECTED   Klebsiella oxytoca NOT DETECTED NOT DETECTED   Klebsiella pneumoniae NOT DETECTED NOT DETECTED   Proteus species NOT DETECTED NOT DETECTED   Serratia marcescens NOT DETECTED NOT DETECTED   Haemophilus influenzae NOT DETECTED NOT DETECTED   Neisseria meningitidis NOT DETECTED NOT DETECTED   Pseudomonas aeruginosa NOT DETECTED NOT DETECTED   Candida albicans NOT DETECTED NOT DETECTED   Candida glabrata NOT DETECTED NOT DETECTED   Candida krusei NOT DETECTED NOT DETECTED   Candida parapsilosis NOT DETECTED NOT DETECTED   Candida tropicalis NOT DETECTED NOT  DETECTED    Adalberto Cole, PharmD, BCPS 06/10/2019 4:32 PM

## 2019-06-11 LAB — COMPREHENSIVE METABOLIC PANEL
ALT: 34 U/L (ref 0–44)
AST: 50 U/L — ABNORMAL HIGH (ref 15–41)
Albumin: 3.3 g/dL — ABNORMAL LOW (ref 3.5–5.0)
Alkaline Phosphatase: 26 U/L — ABNORMAL LOW (ref 38–126)
Anion gap: 10 (ref 5–15)
BUN: 18 mg/dL (ref 6–20)
CO2: 28 mmol/L (ref 22–32)
Calcium: 8.7 mg/dL — ABNORMAL LOW (ref 8.9–10.3)
Chloride: 104 mmol/L (ref 98–111)
Creatinine, Ser: 0.7 mg/dL (ref 0.44–1.00)
GFR calc Af Amer: >60 mL/min (ref >60)
GFR calc non Af Amer: >60 mL/min (ref >60)
Glucose, Bld: 153 mg/dL — ABNORMAL HIGH (ref 70–99)
Potassium: 5.2 mmol/L — ABNORMAL HIGH (ref 3.5–5.1)
Sodium: 142 mmol/L (ref 135–145)
Total Bilirubin: 1.4 mg/dL — ABNORMAL HIGH (ref 0.3–1.2)
Total Protein: 6.4 g/dL — ABNORMAL LOW (ref 6.5–8.1)

## 2019-06-11 LAB — CBC WITH DIFFERENTIAL/PLATELET
Abs Immature Granulocytes: 0.15 10*3/uL — ABNORMAL HIGH (ref 0.00–0.07)
Basophils Absolute: 0 10*3/uL (ref 0.0–0.1)
Basophils Relative: 0 %
Eosinophils Absolute: 0 10*3/uL (ref 0.0–0.5)
Eosinophils Relative: 0 %
HCT: 42.6 % (ref 36.0–46.0)
Hemoglobin: 13 g/dL (ref 12.0–15.0)
Immature Granulocytes: 2 %
Lymphocytes Relative: 14 %
Lymphs Abs: 1 10*3/uL (ref 0.7–4.0)
MCH: 28.6 pg (ref 26.0–34.0)
MCHC: 30.5 g/dL (ref 30.0–36.0)
MCV: 93.6 fL (ref 80.0–100.0)
Monocytes Absolute: 0.5 10*3/uL (ref 0.1–1.0)
Monocytes Relative: 7 %
Neutro Abs: 5.4 10*3/uL (ref 1.7–7.7)
Neutrophils Relative %: 77 %
Platelets: 198 10*3/uL (ref 150–400)
RBC: 4.55 MIL/uL (ref 3.87–5.11)
RDW: 14 % (ref 11.5–15.5)
WBC: 7.1 10*3/uL (ref 4.0–10.5)
nRBC: 0 % (ref 0.0–0.2)

## 2019-06-11 LAB — PHOSPHORUS: Phosphorus: 3.6 mg/dL (ref 2.5–4.6)

## 2019-06-11 LAB — FERRITIN: Ferritin: 259 ng/mL (ref 11–307)

## 2019-06-11 LAB — POTASSIUM: Potassium: 4 mmol/L (ref 3.5–5.1)

## 2019-06-11 LAB — C-REACTIVE PROTEIN: CRP: 3.3 mg/dL — ABNORMAL HIGH (ref <1.0)

## 2019-06-11 LAB — D-DIMER, QUANTITATIVE: D-Dimer, Quant: 0.75 ug/mL-FEU — ABNORMAL HIGH (ref 0.00–0.50)

## 2019-06-11 LAB — MAGNESIUM: Magnesium: 2.2 mg/dL (ref 1.7–2.4)

## 2019-06-11 MED ORDER — GUAIFENESIN-DM 100-10 MG/5ML PO SYRP
10.0000 mL | ORAL_SOLUTION | Freq: Four times a day (QID) | ORAL | Status: DC
  Administered 2019-06-11 – 2019-06-13 (×6): 10 mL via ORAL
  Filled 2019-06-11 (×6): qty 10

## 2019-06-11 MED ORDER — AMLODIPINE BESYLATE 5 MG PO TABS
5.0000 mg | ORAL_TABLET | Freq: Every day | ORAL | Status: DC
  Administered 2019-06-11 – 2019-06-13 (×3): 5 mg via ORAL
  Filled 2019-06-11 (×3): qty 1

## 2019-06-11 NOTE — Progress Notes (Signed)
PROGRESS NOTE    Tracy Orozco  CBJ:[id number] DOB: 28-Jan-1961 DOA: 06/09/2019 PCP: Patient, No Pcp Per   Brief Narrative: Patient is a 59 year old female with no significant past medical history except for seasonal allergies who presented to the emergency department on 5/21 with complaints of progressive headache, headache, congestion, dry cough, shortness of breath.  She was diagnosed with Covid at urgent care at [facility].  On presentation she was afebrile, mildly tachypneic, tachycardic, hypertensive.  Her saturations were 88% on room air.  Lab work showed minimally elevated liver enzyme, elevated CRP, fibrinogen.  Chest x-ray did not show pneumonia.  Was admitted for the management of Covid illness and was admitted because she was hypoxic on room air.  Started on Decadron and remdesivir.  Assessment & Plan:   Principal Problem:   COVID-19 virus infection Active Problems:   Acute respiratory failure with hypoxia (HCC)   Acute respiratory failure with hypoxia: Due to Covid illness.  Currently on 1.5 L of oxygen per minute.  We will try to wean the oxygen.  She feels better.  Covid infection: Chest x-ray on presentation did not show any pneumonia.  She was hypoxic on room air and currently on 1.5 L of oxygen per minute.  Continue to monitor inflammatory markers,improving.  Started on Decadron, remdesivir, day 3.  Continue vitamin C, zinc.  DVT prophylaxis with Lovenox.   Elevated blood pressure: No history of hypertension: Remains hypertensive today.  Monitor closely.  Started on amlodipine.  Hyperkalemia:Continue to monitor  Obesity: BMI of 43.5.         DVT prophylaxis:Lovenox Code Status: Full Family Communication: called and discussed with sister on phone on 06/11/2019. Status is: Inpatient  Remains inpatient appropriate because:IV treatments appropriate due to intensity of illness or inability to take PO   Dispo: The patient is from: Home              Anticipated d/c is  to: Home              Anticipated d/c date is: 2 days              Patient currently is not medically stable to d/c.  Currently on IV remdesivir, requiring oxygen supplementation.  Anticipate discharge to home without oxygen, so continuing remdesivir to complete the course.    Consultants: None  Procedures: None  Antimicrobials:  Anti-infectives (From admission, onward)   Start     Dose/Rate Route Frequency Ordered Stop   06/10/19 1000  remdesivir 100 mg in sodium chloride 0.9 % 100 mL IVPB  Status:  Discontinued     100 mg 200 mL/hr over 30 Minutes Intravenous Daily 06/09/19 1747 06/09/19 1753   06/10/19 1000  remdesivir 100 mg in sodium chloride 0.9 % 100 mL IVPB     100 mg 200 mL/hr over 30 Minutes Intravenous Daily 06/09/19 1755 06/14/19 0959   06/09/19 1800  remdesivir 100 mg in sodium chloride 0.9 % 100 mL IVPB     100 mg 200 mL/hr over 30 Minutes Intravenous Every 1 hr x 2 06/09/19 1755 06/09/19 2334   06/09/19 1747  remdesivir 200 mg in sodium chloride 0.9% 250 mL IVPB  Status:  Discontinued    Note to Pharmacy: Kindly confirm COVID-19 RT-PCR positive status prior to initiation.   200 mg 580 mL/hr over 30 Minutes Intravenous Once 06/09/19 1747 06/09/19 1753      Subjective:  Patient seen and examined at the bedside this morning.  Hemodynamically stable.  Sitting  on the chair.  She feels better today but is bothered by dry cough.  On 1.5 L of oxygen per minute.  Not feeling weak like yesterday.  Objective: Vitals:   06/10/19 0424 06/10/19 0827 06/10/19 1412 06/11/19 0613  BP: 139/79 (!) 150/85 (!) 151/87 (!) 150/93  Pulse: 78 82 77 75  Resp: 18  20 20   Temp: 98.5 F (36.9 C) 97.8 F (36.6 C) 98.6 F (37 C) 98.4 F (36.9 C)  TempSrc: Oral Oral Axillary Oral  SpO2: 97% 90% 92% 95%  Weight:      Height:        Intake/Output Summary (Last 24 hours) at 06/11/2019 0805 Last data filed at 06/10/2019 1600 Gross per 24 hour  Intake 100 ml  Output --  Net 100 ml    Filed Weights   06/09/19 2145  Weight: 122.5 kg    Examination:  General exam: Appears calm and comfortable ,obese HEENT:PERRL,Oral mucosa moist, Ear/Nose normal on gross exam Respiratory system: Bilateral equal air entry, normal vesicular breath sounds, no wheezes or crackles  Cardiovascular system: S1 & S2 heard, RRR. No JVD, murmurs, rubs, gallops or clicks. Gastrointestinal system: Abdomen is nondistended, soft and nontender. No organomegaly or masses felt. Normal bowel sounds heard. Central nervous system: Alert and oriented. No focal neurological deficits. Extremities: No edema, no clubbing ,no cyanosis, distal peripheral pulses palpable.   Data Reviewed: I have personally reviewed following labs and imaging studies  CBC: Recent Labs  Lab 06/09/19 1357 06/10/19 0500 06/11/19 0415  WBC 6.2 5.1 7.1  NEUTROABS 4.8 4.0 5.4  HGB 12.2 12.6 13.0  HCT 38.9 39.9 42.6  MCV 91.7 90.5 93.6  PLT 196 203 198   Basic Metabolic Panel: Recent Labs  Lab 06/09/19 1357 06/10/19 0500 06/11/19 0415  NA 137 140 142  K 3.7 4.2 5.2*  CL 103 104 104  CO2 28 28 28   GLUCOSE 117* 170* 153*  BUN 14 15 18   CREATININE 0.54 0.59 0.70  CALCIUM 8.4* 8.9 8.7*  MG  --  2.0 2.2  PHOS  --  2.3* 3.6   GFR: Estimated Creatinine Clearance: 101.1 mL/min (by C-G formula based on SCr of 0.7 mg/dL). Liver Function Tests: Recent Labs  Lab 06/09/19 1357 06/10/19 0500 06/11/19 0415  AST 48* 38 50*  ALT 38 36 34  ALKPHOS 31* 29* 26*  BILITOT 0.4 0.4 1.4*  PROT 6.5 6.5 6.4*  ALBUMIN 3.4* 3.3* 3.3*   No results for input(s): LIPASE, AMYLASE in the last 168 hours. No results for input(s): AMMONIA in the last 168 hours. Coagulation Profile: No results for input(s): INR, PROTIME in the last 168 hours. Cardiac Enzymes: No results for input(s): CKTOTAL, CKMB, CKMBINDEX, TROPONINI in the last 168 hours. BNP (last 3 results) No results for input(s): PROBNP in the last 8760 hours. HbA1C: No  results for input(s): HGBA1C in the last 72 hours. CBG: No results for input(s): GLUCAP in the last 168 hours. Lipid Profile: Recent Labs    06/09/19 1357  TRIG 85   Thyroid Function Tests: No results for input(s): TSH, T4TOTAL, FREET4, T3FREE, THYROIDAB in the last 72 hours. Anemia Panel: Recent Labs    06/10/19 0500 06/11/19 0415  FERRITIN 222 259   Sepsis Labs: Recent Labs  Lab 06/09/19 1357  PROCALCITON <0.10  LATICACIDVEN 0.7    Recent Results (from the past 240 hour(s))  SARS CORONAVIRUS 2 (TAT 6-24 HRS) Nasopharyngeal Nasopharyngeal Swab     Status: Abnormal   Collection Time:  06/09/19  1:57 PM   Specimen: Nasopharyngeal Swab  Result Value Ref Range Status   SARS Coronavirus 2 POSITIVE (A) NEGATIVE Final    Comment: RESULT CALLED TO, READ BACK BY AND VERIFIED WITH: MICHUDA K, RN AT 0052 ON 06/10/2019 BY SAINVILUS S (NOTE) SARS-CoV-2 target nucleic acids are DETECTED. The SARS-CoV-2 RNA is generally detectable in upper and lower respiratory specimens during the acute phase of infection. Positive results are indicative of the presence of SARS-CoV-2 RNA. Clinical correlation with patient history and other diagnostic information is  necessary to determine patient infection status. Positive results do not rule out bacterial infection or co-infection with other viruses.  The expected result is Negative. Fact Sheet for Patients: SugarRoll.be Fact Sheet for Healthcare Providers: https://www.woods-mathews.com/ This test is not yet approved or cleared by the [place] FDA and  has been authorized for detection and/or diagnosis of SARS-CoV-2 by FDA under an Emergency Use Authorization (EUA). This EUA will remain  in effect (meaning this test can b e used) for the duration of the COVID-19 declaration under Section 564(b)(1) of the Act, 21 U.S.C. section 360bbb-3(b)(1), unless the authorization is terminated or revoked  sooner. Performed at [facility], [place] [address] [place], [place] [address]   Blood Culture (routine x 2)     Status: None (Preliminary result)   Collection Time: 06/09/19  1:57 PM   Specimen: BLOOD  Result Value Ref Range Status   Specimen Description   Final    BLOOD RIGHT ANTECUBITAL Performed at [facility] [address], [place], [place] [address]    Special Requests   Final    BOTTLES DRAWN AEROBIC AND ANAEROBIC Blood Culture results may not be optimal due to an excessive volume of blood received in culture bottles Performed at [facility] [address], [place], [place] [address]    Culture  Setup Time   Final    GRAM POSITIVE COCCI AEROBIC BOTTLE ONLY Organism ID to follow CRITICAL RESULT CALLED TO, READ BACK BY AND VERIFIED WITH: Nani Skillern [id number] MLM Performed at [facility], [place] [address], [place], [place] [address]    Culture GRAM POSITIVE COCCI  Final   Report Status PENDING  Incomplete  Blood Culture ID Panel (Reflexed)     Status: Abnormal   Collection Time: 06/09/19  1:57 PM  Result Value Ref Range Status   Enterococcus species NOT DETECTED NOT DETECTED Final   Listeria monocytogenes NOT DETECTED NOT DETECTED Final   Staphylococcus species DETECTED (A) NOT DETECTED Final    Comment: Methicillin (oxacillin) susceptible coagulase negative staphylococcus. Possible blood culture contaminant (unless isolated from more than one blood culture draw or clinical case suggests pathogenicity). No antibiotic treatment is indicated for blood  culture contaminants. CRITICAL RESULT CALLED TO, READ BACK BY AND VERIFIED WITH: PHARMD N GLOGOVAC [id number] MLM    Staphylococcus aureus (BCID) NOT DETECTED NOT DETECTED Final   Methicillin resistance NOT DETECTED NOT DETECTED Final   Streptococcus species NOT DETECTED NOT DETECTED Final   Streptococcus agalactiae NOT DETECTED NOT DETECTED Final   Streptococcus  pneumoniae NOT DETECTED NOT DETECTED Final   Streptococcus pyogenes NOT DETECTED NOT DETECTED Final   Acinetobacter baumannii NOT DETECTED NOT DETECTED Final   Enterobacteriaceae species NOT DETECTED NOT DETECTED Final   Enterobacter cloacae complex NOT DETECTED NOT DETECTED Final   Escherichia coli NOT DETECTED NOT DETECTED Final   Klebsiella oxytoca NOT DETECTED NOT DETECTED Final  Klebsiella pneumoniae NOT DETECTED NOT DETECTED Final   Proteus species NOT DETECTED NOT DETECTED Final   Serratia marcescens NOT DETECTED NOT DETECTED Final   Haemophilus influenzae NOT DETECTED NOT DETECTED Final   Neisseria meningitidis NOT DETECTED NOT DETECTED Final   Pseudomonas aeruginosa NOT DETECTED NOT DETECTED Final   Candida albicans NOT DETECTED NOT DETECTED Final   Candida glabrata NOT DETECTED NOT DETECTED Final   Candida krusei NOT DETECTED NOT DETECTED Final   Candida parapsilosis NOT DETECTED NOT DETECTED Final   Candida tropicalis NOT DETECTED NOT DETECTED Final    Comment: Performed at [facility], [address], [place], [place] [address]  Blood Culture (routine x 2)     Status: None (Preliminary result)   Collection Time: 06/09/19  2:02 PM   Specimen: BLOOD  Result Value Ref Range Status   Specimen Description   Final    BLOOD RIGHT HAND Performed at [facility], [address], [place], [place] [address]    Special Requests   Final    BOTTLES DRAWN AEROBIC AND ANAEROBIC Blood Culture results may not be optimal due to an excessive volume of blood received in culture bottles Performed at [facility], [address] [place], [place] [address]    Culture   Final    NO GROWTH 2 DAYS Performed at [facility], [address], [place], [place] [address]    Report Status PENDING  Incomplete  Respiratory Panel by RT PCR (Flu A&B, Covid) - Nasopharyngeal Swab     Status: Abnormal   Collection Time: 06/09/19  5:40 PM   Specimen:  Nasopharyngeal Swab  Result Value Ref Range Status   SARS Coronavirus 2 by RT PCR POSITIVE (A) NEGATIVE Final    Comment: RESULT CALLED TO, READ BACK BY AND VERIFIED WITH: J.Princella Ion [id number] @2201  BY V.WILKINS (NOTE) SARS-CoV-2 target nucleic acids are DETECTED. SARS-CoV-2 RNA is generally detectable in upper respiratory specimens  during the acute phase of infection. Positive results are indicative of the presence of the identified virus, but do not rule out bacterial infection or co-infection with other pathogens not detected by the test. Clinical correlation with patient history and other diagnostic information is necessary to determine patient infection status. The expected result is Negative. Fact Sheet for Patients:  Fact Sheet for Healthcare Providers: https://www.moore.com/ This test is not yet approved or cleared by the https://www.young.biz/ FDA and  has been authorized for detection and/or diagnosis of SARS-CoV-2 by FDA under an Emergency Use Authorization (EUA).  This EUA will remain in effect (meaning this test can be u sed) for the duration of  the COVID-19 declaration under Section 564(b)(1) of the Act, 21 U.S.C. section 360bbb-3(b)(1), unless the authorization is terminated or revoked sooner.    Influenza A by PCR NEGATIVE NEGATIVE Final   Influenza B by PCR NEGATIVE NEGATIVE Final    Comment: (NOTE) The Xpert Xpress SARS-CoV-2/FLU/RSV assay is intended as an aid in  the diagnosis of influenza from Nasopharyngeal swab specimens and  should not be used as a sole basis for treatment. Nasal washings and  aspirates are unacceptable for Xpert Xpress SARS-CoV-2/FLU/RSV  testing. Fact Sheet for Patients: [place] Fact Sheet for Healthcare Providers: https://www.moore.com/ This test is not yet approved or cleared by the https://www.young.biz/ FDA and  has been authorized  for detection and/or diagnosis of SARS-CoV-2 by  FDA under an Emergency Use Authorization (EUA). This EUA will remain  in effect (meaning  this test can be used) for the duration of the  Covid-19 declaration under Section 564(b)(1) of the Act, 21  U.S.C. section 360bbb-3(b)(1), unless the authorization is  terminated or revoked. Performed at [facility], [address] [place], [place] [address]          Radiology Studies: [facility] Port 1 View  Result Date: 06/09/2019 CLINICAL DATA:  Cough and fatigue with decreased oxygen saturation EXAM: PORTABLE CHEST 1 VIEW COMPARISON:  None. FINDINGS: Lungs are clear. Heart size and pulmonary vascularity are normal. No adenopathy. No bone lesions. IMPRESSION: Lungs clear.  Cardiac silhouette within normal limits. Electronically Signed   By: Bretta BangWilliam  Woodruff III M.D.   On: 06/09/2019 14:15        Scheduled Meds: . albuterol  2 puff Inhalation Q6H  . vitamin C  500 mg Oral Daily  . dexamethasone (DECADRON) injection  6 mg Intravenous Q24H  . enoxaparin (LOVENOX) injection  60 mg Subcutaneous QHS  . sodium chloride flush  3 mL Intravenous Q12H  . zinc sulfate  220 mg Oral Daily   Continuous Infusions: . sodium chloride    . remdesivir 100 mg in NS 100 mL 100 mg (06/10/19 1005)     LOS: 2 days    Time spent: More than 50% of that time was spent in counseling and/or coordination of care.      Burnadette PopAmrit Jamier Urbas, MD [facility] P5/11/2019, 8:05 AM

## 2019-06-11 NOTE — TOC Progression Note (Signed)
Transition of Care [facility]) - Progression Note    Patient Details  Name: Tracy Orozco MRN: [id number] Date of Birth: 09-Jun-1960  [facility]) CM/SW Contact  Armanda Heritage, RN Phone Number: 06/11/2019, 3:25 PM  Clinical Narrative:    CM received call from Dr Deneen Harts office.  New patient coordinator provided with patient information to schedule follow-up pcp visit.  Office to contact patient directly regarding scheduling.   Expected Discharge Plan: Home/Self Care Barriers to Discharge: Continued Medical Work up  Expected Discharge Plan and Services Expected Discharge Plan: Home/Self Care   Discharge Planning Services: CM Consult   Living arrangements for the past 2 months: Single Family Home                 DME Arranged: N/A DME Agency: NA       HH Arranged: NA HH Agency: NA         Social Determinants of Health (SDOH) Interventions    Readmission Risk Interventions No flowsheet data found.

## 2019-06-11 NOTE — TOC Initial Note (Signed)
Transition of Care [facility]) - Initial/Assessment Note    Patient Details  Name: Zariah Cavendish MRN: [id number] Date of Birth: [birth year]  Transition of Care (TOC) CM/SW Contact:    Armanda Heritage, RN Phone Number: 06/11/2019, 12:23 PM  Clinical Narrative:                 CM spoke with patient who reports she recently relocated to the [facility] area and intended to establish pcp services with Dr Wylene Simmer but got sick with Covid before she could schedule an appointment.  CM left a voice mail for the new patient coordinator in Dr Deneen Harts office with a request for patient to be scheduled for a hospital follow-up and to establish care with the clinic.   Expected Discharge Plan: Home/Self Care Barriers to Discharge: Continued Medical Work up   Patient Goals and CMS Choice Patient states their goals for this hospitalization and ongoing recovery are:: to get better      Expected Discharge Plan and Services Expected Discharge Plan: Home/Self Care   Discharge Planning Services: CM Consult   Living arrangements for the past 2 months: Single Family Home                 DME Arranged: N/A DME Agency: NA       HH Arranged: NA HH Agency: NA        Prior Living Arrangements/Services Living arrangements for the past 2 months: Single Family Home   Patient language and need for interpreter reviewed:: Yes Do you feel safe going back to the place where you live?: Yes      Need for Family Participation in Patient Care: Yes (Comment) Care giver support system in place?: Yes (comment)   Criminal Activity/Legal Involvement Pertinent to Current Situation/Hospitalization: No - Comment as needed  Activities of Daily Living Home Assistive Devices/Equipment: None ADL Screening (condition at time of admission) Patient's cognitive ability adequate to safely complete daily activities?: Yes Is the patient deaf or have difficulty hearing?: No Does the patient have difficulty seeing, even when  wearing glasses/contacts?: No Does the patient have difficulty concentrating, remembering, or making decisions?: No Patient able to express need for assistance with ADLs?: Yes Does the patient have difficulty dressing or bathing?: No Independently performs ADLs?: Yes (appropriate for developmental age) Does the patient have difficulty walking or climbing stairs?: No Weakness of Legs: None Weakness of Arms/Hands: None  Permission Sought/Granted                  Emotional Assessment Appearance:: Appears stated age Attitude/Demeanor/Rapport: Engaged Affect (typically observed): Accepting Orientation: : Oriented to Self, Oriented to Place, Oriented to  Time, Oriented to Situation   Psych Involvement: No (comment)  Admission diagnosis:  Cough [R05] Hypoxia [R09.02] COVID-19 virus infection [U07.1] COVID-19 [U07.1] Patient Active Problem List   Diagnosis Date Noted  . COVID-19 virus infection 06/09/2019  . Acute respiratory failure with hypoxia (HCC) 06/09/2019   PCP:  Patient, No Pcp Per Pharmacy:   [id number] - [place], [place] - [address] [place] [address] Phone: [phone] Fax: [phone]     Social Determinants of Health (SDOH) Interventions    Readmission Risk Interventions No flowsheet data found.

## 2019-06-11 NOTE — Plan of Care (Signed)

## 2019-06-12 LAB — CULTURE, BLOOD (ROUTINE X 2)

## 2019-06-12 LAB — COMPREHENSIVE METABOLIC PANEL
ALT: 35 U/L (ref 0–44)
AST: 27 U/L (ref 15–41)
Albumin: 3.7 g/dL (ref 3.5–5.0)
Alkaline Phosphatase: 32 U/L — ABNORMAL LOW (ref 38–126)
Anion gap: 12 (ref 5–15)
BUN: 19 mg/dL (ref 6–20)
CO2: 26 mmol/L (ref 22–32)
Calcium: 9.2 mg/dL (ref 8.9–10.3)
Chloride: 104 mmol/L (ref 98–111)
Creatinine, Ser: 0.66 mg/dL (ref 0.44–1.00)
GFR calc Af Amer: >60 mL/min (ref >60)
GFR calc non Af Amer: >60 mL/min (ref >60)
Glucose, Bld: 156 mg/dL — ABNORMAL HIGH (ref 70–99)
Potassium: 4.4 mmol/L (ref 3.5–5.1)
Sodium: 142 mmol/L (ref 135–145)
Total Bilirubin: 0.5 mg/dL (ref 0.3–1.2)
Total Protein: 7 g/dL (ref 6.5–8.1)

## 2019-06-12 LAB — PHOSPHORUS: Phosphorus: 3.6 mg/dL (ref 2.5–4.6)

## 2019-06-12 LAB — CBC WITH DIFFERENTIAL/PLATELET
Abs Immature Granulocytes: 0.5 10*3/uL — ABNORMAL HIGH (ref 0.00–0.07)
Basophils Absolute: 0.1 10*3/uL (ref 0.0–0.1)
Basophils Relative: 1 %
Eosinophils Absolute: 0 10*3/uL (ref 0.0–0.5)
Eosinophils Relative: 0 %
HCT: 45.3 % (ref 36.0–46.0)
Hemoglobin: 14.2 g/dL (ref 12.0–15.0)
Immature Granulocytes: 5 %
Lymphocytes Relative: 15 %
Lymphs Abs: 1.4 10*3/uL (ref 0.7–4.0)
MCH: 28.1 pg (ref 26.0–34.0)
MCHC: 31.3 g/dL (ref 30.0–36.0)
MCV: 89.7 fL (ref 80.0–100.0)
Monocytes Absolute: 0.6 10*3/uL (ref 0.1–1.0)
Monocytes Relative: 7 %
Neutro Abs: 6.7 10*3/uL (ref 1.7–7.7)
Neutrophils Relative %: 72 %
Platelets: 279 10*3/uL (ref 150–400)
RBC: 5.05 MIL/uL (ref 3.87–5.11)
RDW: 13.7 % (ref 11.5–15.5)
WBC: 9.3 10*3/uL (ref 4.0–10.5)
nRBC: 0 % (ref 0.0–0.2)

## 2019-06-12 LAB — D-DIMER, QUANTITATIVE: D-Dimer, Quant: 0.68 ug/mL-FEU — ABNORMAL HIGH (ref 0.00–0.50)

## 2019-06-12 LAB — MAGNESIUM: Magnesium: 2 mg/dL (ref 1.7–2.4)

## 2019-06-12 LAB — FERRITIN: Ferritin: 262 ng/mL (ref 11–307)

## 2019-06-12 LAB — C-REACTIVE PROTEIN: CRP: 1.1 mg/dL — ABNORMAL HIGH (ref <1.0)

## 2019-06-12 NOTE — Progress Notes (Signed)
PROGRESS NOTE    Tracy Orozco  VOH:[id number] DOB: 08-Dec-1960 DOA: 06/09/2019 PCP: Patient, No Pcp Per   Brief Narrative: Patient is a 59 year old female with no significant past medical history except for seasonal allergies who presented to the emergency department on 5/21 with complaints of progressive headache, headache, congestion, dry cough, shortness of breath.  She was diagnosed with Covid at urgent care at [facility].  On presentation she was afebrile, mildly tachypneic, tachycardic, hypertensive.  Her saturations were 88% on room air.  Lab work showed minimally elevated liver enzyme, elevated CRP, fibrinogen.  Chest x-ray did not show pneumonia.  Was admitted for the management of Covid illness and was admitted because she was hypoxic on room air.  Started on Decadron and remdesivir.  Assessment & Plan:   Principal Problem:   COVID-19 virus infection Active Problems:   Acute respiratory failure with hypoxia (HCC)   Acute respiratory failure with hypoxia: Due to Covid illness.  This morning she was maintaining her saturation on room air but becomes dyspneic on minimal exertion.  She feels better today.  Covid infection: Chest x-ray on presentation did not show any pneumonia.  She was hypoxic on room air on presentation, today she is on room air..  Continue to monitor inflammatory markers,improving.  Started on Decadron, remdesivir, day 4.  Continue vitamin C, zinc.  DVT prophylaxis with Lovenox.   Elevated blood pressure: No history of hypertension.  Started on amlodipine.  Hyperkalemia:Resolved  Obesity: BMI of 43.5.         DVT prophylaxis:Lovenox Code Status: Full Family Communication: called and discussed with sister on phone on 06/11/2019. Status is: Inpatient  Remains inpatient appropriate because:IV treatments appropriate due to intensity of illness or inability to take PO   Dispo: The patient is from: Home              Anticipated d/c is to: Home  Anticipated d/c date is: 1 day              Patient currently is not medically stable to d/c.  Currently on IV remdesivir, plan for discharge tomorrow after completion of the course   Consultants: None  Procedures: None  Antimicrobials:  Anti-infectives (From admission, onward)   Start     Dose/Rate Route Frequency Ordered Stop   06/10/19 1000  remdesivir 100 mg in sodium chloride 0.9 % 100 mL IVPB  Status:  Discontinued     100 mg 200 mL/hr over 30 Minutes Intravenous Daily 06/09/19 1747 06/09/19 1753   06/10/19 1000  remdesivir 100 mg in sodium chloride 0.9 % 100 mL IVPB     100 mg 200 mL/hr over 30 Minutes Intravenous Daily 06/09/19 1755 06/14/19 0959   06/09/19 1800  remdesivir 100 mg in sodium chloride 0.9 % 100 mL IVPB     100 mg 200 mL/hr over 30 Minutes Intravenous Every 1 hr x 2 06/09/19 1755 06/09/19 2334   06/09/19 1747  remdesivir 200 mg in sodium chloride 0.9% 250 mL IVPB  Status:  Discontinued    Note to Pharmacy: Kindly confirm COVID-19 RT-PCR positive status prior to initiation.   200 mg 580 mL/hr over 30 Minutes Intravenous Once 06/09/19 1747 06/09/19 1753      Subjective:  Patient seen and examined at the bedside this morning.  This morning she was maintaining her saturation on room air but becomes dyspneic on exertion.  She says she feels better today, cough is better.eager  to go home tomorrow.  Objective: Vitals:  06/11/19 2300 06/12/19 0507 06/12/19 0630 06/12/19 1145  BP:  (!) 153/101  (!) 146/96  Pulse:  77  76  Resp:  18  16  Temp:  97.8 F (36.6 C)  98.2 F (36.8 C)  TempSrc:  Oral  Oral  SpO2: 92% 92% 93% 91%  Weight:      Height:        Intake/Output Summary (Last 24 hours) at 06/12/2019 1333 Last data filed at 06/11/2019 2035 Gross per 24 hour  Intake 200 ml  Output --  Net 200 ml   Filed Weights   06/09/19 2145  Weight: 122.5 kg    Examination:   General exam: Appears calm and comfortable ,Not in  distress,obese HEENT:PERRL,Oral mucosa moist, Ear/Nose normal on gross exam Respiratory system: Bilateral equal air entry, normal vesicular breath sounds, no wheezes or crackles  Cardiovascular system: S1 & S2 heard, RRR. No JVD, murmurs, rubs, gallops or clicks. Gastrointestinal system: Abdomen is nondistended, soft and nontender. No organomegaly or masses felt. Normal bowel sounds heard. Central nervous system: Alert and oriented. No focal neurological deficits. Extremities: No edema, no clubbing ,no cyanosis, distal peripheral pulses palpable. Skin: No rashes, lesions or ulcers,no icterus ,no pallor   Data Reviewed: I have personally reviewed following labs and imaging studies  CBC: Recent Labs  Lab 06/09/19 1357 06/10/19 0500 06/11/19 0415 06/12/19 0309  WBC 6.2 5.1 7.1 9.3  NEUTROABS 4.8 4.0 5.4 6.7  HGB 12.2 12.6 13.0 14.2  HCT 38.9 39.9 42.6 45.3  MCV 91.7 90.5 93.6 89.7  PLT 196 203 198 279   Basic Metabolic Panel: Recent Labs  Lab 06/09/19 1357 06/10/19 0500 06/11/19 0415 06/11/19 1834 06/12/19 0309  NA 137 140 142  --  142  K 3.7 4.2 5.2* 4.0 4.4  CL 103 104 104  --  104  CO2 28 28 28   --  26  GLUCOSE 117* 170* 153*  --  156*  BUN 14 15 18   --  19  CREATININE 0.54 0.59 0.70  --  0.66  CALCIUM 8.4* 8.9 8.7*  --  9.2  MG  --  2.0 2.2  --  2.0  PHOS  --  2.3* 3.6  --  3.6   GFR: Estimated Creatinine Clearance: 101.1 mL/min (by C-G formula based on SCr of 0.66 mg/dL). Liver Function Tests: Recent Labs  Lab 06/09/19 1357 06/10/19 0500 06/11/19 0415 06/12/19 0309  AST 48* 38 50* 27  ALT 38 36 34 35  ALKPHOS 31* 29* 26* 32*  BILITOT 0.4 0.4 1.4* 0.5  PROT 6.5 6.5 6.4* 7.0  ALBUMIN 3.4* 3.3* 3.3* 3.7   No results for input(s): LIPASE, AMYLASE in the last 168 hours. No results for input(s): AMMONIA in the last 168 hours. Coagulation Profile: No results for input(s): INR, PROTIME in the last 168 hours. Cardiac Enzymes: No results for input(s):  CKTOTAL, CKMB, CKMBINDEX, TROPONINI in the last 168 hours. BNP (last 3 results) No results for input(s): PROBNP in the last 8760 hours. HbA1C: No results for input(s): HGBA1C in the last 72 hours. CBG: No results for input(s): GLUCAP in the last 168 hours. Lipid Profile: Recent Labs    06/09/19 1357  TRIG 85   Thyroid Function Tests: No results for input(s): TSH, T4TOTAL, FREET4, T3FREE, THYROIDAB in the last 72 hours. Anemia Panel: Recent Labs    06/11/19 0415 06/12/19 0309  FERRITIN 259 262   Sepsis Labs: Recent Labs  Lab 06/09/19 1357  PROCALCITON <0.10  LATICACIDVEN 0.7  Recent Results (from the past 240 hour(s))  SARS CORONAVIRUS 2 (TAT 6-24 HRS) Nasopharyngeal Nasopharyngeal Swab     Status: Abnormal   Collection Time: 06/09/19  1:57 PM   Specimen: Nasopharyngeal Swab  Result Value Ref Range Status   SARS Coronavirus 2 POSITIVE (A) NEGATIVE Final    Comment: RESULT CALLED TO, READ BACK BY AND VERIFIED WITH: MICHUDA K, RN AT 0052 ON 06/10/2019 BY SAINVILUS S (NOTE) SARS-CoV-2 target nucleic acids are DETECTED. The SARS-CoV-2 RNA is generally detectable in upper and lower respiratory specimens during the acute phase of infection. Positive results are indicative of the presence of SARS-CoV-2 RNA. Clinical correlation with patient history and other diagnostic information is  necessary to determine patient infection status. Positive results do not rule out bacterial infection or co-infection with other viruses.  The expected result is Negative. Fact Sheet for Patients: HairSlick.no Fact Sheet for Healthcare Providers: quierodirigir.com This test is not yet approved or cleared by the [place] FDA and  has been authorized for detection and/or diagnosis of SARS-CoV-2 by FDA under an Emergency Use Authorization (EUA). This EUA will remain  in effect (meaning this test can b e used) for the duration of  the COVID-19 declaration under Section 564(b)(1) of the Act, 21 U.S.C. section 360bbb-3(b)(1), unless the authorization is terminated or revoked sooner. Performed at [facility], [address] [place], [place] [address]   Blood Culture (routine x 2)     Status: Abnormal   Collection Time: 06/09/19  1:57 PM   Specimen: BLOOD  Result Value Ref Range Status   Specimen Description   Final    BLOOD RIGHT ANTECUBITAL Performed at [facility], [address], [place], [place] [address]    Special Requests   Final    BOTTLES DRAWN AEROBIC AND ANAEROBIC Blood Culture results may not be optimal due to an excessive volume of blood received in culture bottles Performed at [facility], [address] [place], [place] [address]    Culture  Setup Time   Final    GRAM POSITIVE COCCI AEROBIC BOTTLE ONLY CRITICAL RESULT CALLED TO, READ BACK BY AND VERIFIED WITH: PHARMD N GLOGOVAC [id number] MLM    Culture (A)  Final    STAPHYLOCOCCUS SPECIES (COAGULASE NEGATIVE) THE SIGNIFICANCE OF ISOLATING THIS ORGANISM FROM A SINGLE SET OF BLOOD CULTURES WHEN MULTIPLE SETS ARE DRAWN IS UNCERTAIN. PLEASE NOTIFY THE MICROBIOLOGY DEPARTMENT WITHIN ONE WEEK IF SPECIATION AND SENSITIVITIES ARE REQUIRED. Performed at [facility], [address], [place], [place] [address]    Report Status 06/12/2019 FINAL  Final  Blood Culture ID Panel (Reflexed)     Status: Abnormal   Collection Time: 06/09/19  1:57 PM  Result Value Ref Range Status   Enterococcus species NOT DETECTED NOT DETECTED Final   Listeria monocytogenes NOT DETECTED NOT DETECTED Final   Staphylococcus species DETECTED (A) NOT DETECTED Final    Comment: Methicillin (oxacillin) susceptible coagulase negative staphylococcus. Possible blood culture contaminant (unless isolated from more than one blood culture draw or clinical case suggests pathogenicity). No antibiotic treatment is indicated for blood   culture contaminants. CRITICAL RESULT CALLED TO, READ BACK BY AND VERIFIED WITH: PHARMD N GLOGOVAC [id number] MLM    Staphylococcus aureus (BCID) NOT DETECTED NOT DETECTED Final   Methicillin resistance NOT DETECTED NOT DETECTED Final   Streptococcus species NOT DETECTED NOT DETECTED Final   Streptococcus agalactiae NOT DETECTED NOT DETECTED Final   Streptococcus pneumoniae  NOT DETECTED NOT DETECTED Final   Streptococcus pyogenes NOT DETECTED NOT DETECTED Final   Acinetobacter baumannii NOT DETECTED NOT DETECTED Final   Enterobacteriaceae species NOT DETECTED NOT DETECTED Final   Enterobacter cloacae complex NOT DETECTED NOT DETECTED Final   Escherichia coli NOT DETECTED NOT DETECTED Final   Klebsiella oxytoca NOT DETECTED NOT DETECTED Final   Klebsiella pneumoniae NOT DETECTED NOT DETECTED Final   Proteus species NOT DETECTED NOT DETECTED Final   Serratia marcescens NOT DETECTED NOT DETECTED Final   Haemophilus influenzae NOT DETECTED NOT DETECTED Final   Neisseria meningitidis NOT DETECTED NOT DETECTED Final   Pseudomonas aeruginosa NOT DETECTED NOT DETECTED Final   Candida albicans NOT DETECTED NOT DETECTED Final   Candida glabrata NOT DETECTED NOT DETECTED Final   Candida krusei NOT DETECTED NOT DETECTED Final   Candida parapsilosis NOT DETECTED NOT DETECTED Final   Candida tropicalis NOT DETECTED NOT DETECTED Final    Comment: Performed at [facility], [address], [place], [place] [address]  Blood Culture (routine x 2)     Status: None (Preliminary result)   Collection Time: 06/09/19  2:02 PM   Specimen: BLOOD  Result Value Ref Range Status   Specimen Description   Final    BLOOD RIGHT HAND Performed at [facility], [address], [place], [place] [address]    Special Requests   Final    BOTTLES DRAWN AEROBIC AND ANAEROBIC Blood Culture results may not be optimal due to an excessive volume of blood received in culture bottles Performed at  [facility], [address], [place], [place] [address]    Culture   Final    NO GROWTH 3 DAYS Performed at [facility], [address] [place], [place] [address]    Report Status PENDING  Incomplete  Respiratory Panel by RT PCR (Flu A&B, Covid) - Nasopharyngeal Swab     Status: Abnormal   Collection Time: 06/09/19  5:40 PM   Specimen: Nasopharyngeal Swab  Result Value Ref Range Status   SARS Coronavirus 2 by RT PCR POSITIVE (A) NEGATIVE Final    Comment: RESULT CALLED TO, READ BACK BY AND VERIFIED WITH: J.Princella Ion [id number] @2201  BY V.WILKINS (NOTE) SARS-CoV-2 target nucleic acids are DETECTED. SARS-CoV-2 RNA is generally detectable in upper respiratory specimens  during the acute phase of infection. Positive results are indicative of the presence of the identified virus, but do not rule out bacterial infection or co-infection with other pathogens not detected by the test. Clinical correlation with patient history and other diagnostic information is necessary to determine patient infection status. The expected result is Negative. Fact Sheet for Patients:  Fact Sheet for Healthcare Providers: https://www.moore.com/ This test is not yet approved or cleared by the https://www.young.biz/ FDA and  has been authorized for detection and/or diagnosis of SARS-CoV-2 by FDA under an Emergency Use Authorization (EUA).  This EUA will remain in effect (meaning this test can be u sed) for the duration of  the COVID-19 declaration under Section 564(b)(1) of the Act, 21 U.S.C. section 360bbb-3(b)(1), unless the authorization is terminated or revoked sooner.    Influenza A by PCR NEGATIVE NEGATIVE Final   Influenza B by PCR NEGATIVE NEGATIVE Final    Comment: (NOTE) The Xpert Xpress SARS-CoV-2/FLU/RSV assay is intended as an aid in  the diagnosis of influenza from Nasopharyngeal swab specimens and  should not be  used as a sole basis for treatment. Nasal washings and  aspirates are unacceptable for Xpert Xpress SARS-CoV-2/FLU/RSV  testing. Fact Sheet for Patients: PinkCheek.be Fact Sheet for Healthcare Providers: GravelBags.it This test is not yet approved or cleared by the [place] FDA and  has been authorized for detection and/or diagnosis of SARS-CoV-2 by  FDA under an Emergency Use Authorization (EUA). This EUA will remain  in effect (meaning this test can be used) for the duration of the  Covid-19 declaration under Section 564(b)(1) of the Act, 21  U.S.C. section 360bbb-3(b)(1), unless the authorization is  terminated or revoked. Performed at [facility], [place] [address] [place], [place] [address]          Radiology Studies: No results found.      Scheduled Meds: . albuterol  2 puff Inhalation Q6H  . amLODipine  5 mg Oral Daily  . vitamin C  500 mg Oral Daily  . dexamethasone (DECADRON) injection  6 mg Intravenous Q24H  . enoxaparin (LOVENOX) injection  60 mg Subcutaneous QHS  . guaiFENesin-dextromethorphan  10 mL Oral Q6H  . sodium chloride flush  3 mL Intravenous Q12H  . zinc sulfate  220 mg Oral Daily   Continuous Infusions: . sodium chloride    . remdesivir 100 mg in NS 100 mL 100 mg (06/12/19 1204)     LOS: 3 days    Time spent: 25 mins.More than 50% of that time was spent in counseling and/or coordination of care.      Shelly Coss, MD [facility] P5/12/2019, 1:33 PM

## 2019-06-13 ENCOUNTER — Encounter (INDEPENDENT_AMBULATORY_CARE_PROVIDER_SITE_OTHER): Payer: Self-pay

## 2019-06-13 ENCOUNTER — Encounter (HOSPITAL_COMMUNITY): Payer: Self-pay | Admitting: Internal Medicine

## 2019-06-13 LAB — CBC WITH DIFFERENTIAL/PLATELET
Abs Immature Granulocytes: 0.88 10*3/uL — ABNORMAL HIGH (ref 0.00–0.07)
Basophils Absolute: 0 10*3/uL (ref 0.0–0.1)
Basophils Relative: 0 %
Eosinophils Absolute: 0 10*3/uL (ref 0.0–0.5)
Eosinophils Relative: 0 %
HCT: 44.4 % (ref 36.0–46.0)
Hemoglobin: 13.9 g/dL (ref 12.0–15.0)
Immature Granulocytes: 9 %
Lymphocytes Relative: 15 %
Lymphs Abs: 1.5 10*3/uL (ref 0.7–4.0)
MCH: 28.1 pg (ref 26.0–34.0)
MCHC: 31.3 g/dL (ref 30.0–36.0)
MCV: 89.9 fL (ref 80.0–100.0)
Monocytes Absolute: 0.7 10*3/uL (ref 0.1–1.0)
Monocytes Relative: 7 %
Neutro Abs: 6.4 10*3/uL (ref 1.7–7.7)
Neutrophils Relative %: 69 %
Platelets: 324 10*3/uL (ref 150–400)
RBC: 4.94 MIL/uL (ref 3.87–5.11)
RDW: 13.8 % (ref 11.5–15.5)
WBC: 9.4 10*3/uL (ref 4.0–10.5)
nRBC: 0.2 % (ref 0.0–0.2)

## 2019-06-13 LAB — COMPREHENSIVE METABOLIC PANEL
ALT: 32 U/L (ref 0–44)
AST: 22 U/L (ref 15–41)
Albumin: 3.5 g/dL (ref 3.5–5.0)
Alkaline Phosphatase: 32 U/L — ABNORMAL LOW (ref 38–126)
Anion gap: 9 (ref 5–15)
BUN: 22 mg/dL — ABNORMAL HIGH (ref 6–20)
CO2: 27 mmol/L (ref 22–32)
Calcium: 8.9 mg/dL (ref 8.9–10.3)
Chloride: 106 mmol/L (ref 98–111)
Creatinine, Ser: 0.75 mg/dL (ref 0.44–1.00)
GFR calc Af Amer: >60 mL/min (ref >60)
GFR calc non Af Amer: >60 mL/min (ref >60)
Glucose, Bld: 192 mg/dL — ABNORMAL HIGH (ref 70–99)
Potassium: 4.4 mmol/L (ref 3.5–5.1)
Sodium: 142 mmol/L (ref 135–145)
Total Bilirubin: 0.6 mg/dL (ref 0.3–1.2)
Total Protein: 6.7 g/dL (ref 6.5–8.1)

## 2019-06-13 LAB — D-DIMER, QUANTITATIVE: D-Dimer, Quant: 0.5 ug/mL-FEU (ref 0.00–0.50)

## 2019-06-13 LAB — FERRITIN: Ferritin: 217 ng/mL (ref 11–307)

## 2019-06-13 LAB — PHOSPHORUS: Phosphorus: 4 mg/dL (ref 2.5–4.6)

## 2019-06-13 LAB — MAGNESIUM: Magnesium: 2 mg/dL (ref 1.7–2.4)

## 2019-06-13 LAB — C-REACTIVE PROTEIN: CRP: 0.8 mg/dL (ref <1.0)

## 2019-06-13 MED ORDER — PANTOPRAZOLE SODIUM 40 MG PO TBEC
40.0000 mg | DELAYED_RELEASE_TABLET | Freq: Every day | ORAL | 0 refills | Status: DC
Start: 2019-06-13 — End: 2019-08-21

## 2019-06-13 MED ORDER — GUAIFENESIN-DM 100-10 MG/5ML PO SYRP: 10.0000 mL | ORAL_SOLUTION | Freq: Four times a day (QID) | ORAL | 1 refills | Status: DC | PRN

## 2019-06-13 MED ORDER — ZINC SULFATE 220 (50 ZN) MG PO CAPS: 220.0000 mg | ORAL_CAPSULE | Freq: Every day | ORAL | 0 refills | Status: AC

## 2019-06-13 MED ORDER — AMLODIPINE BESYLATE 5 MG PO TABS: 5.0000 mg | ORAL_TABLET | Freq: Every day | ORAL | 1 refills | Status: DC

## 2019-06-13 MED ORDER — DEXAMETHASONE 6 MG PO TABS
6.0000 mg | ORAL_TABLET | Freq: Every day | ORAL | 0 refills | Status: DC
Start: 2019-06-14 — End: 2019-08-21

## 2019-06-13 MED ORDER — ASCORBIC ACID 500 MG PO TABS: 500.0000 mg | ORAL_TABLET | Freq: Every day | ORAL | 0 refills | Status: AC

## 2019-06-13 NOTE — Discharge Summary (Signed)
Physician Discharge Summary  Kobe Jansma EGB:[id number] DOB: 30-Nov-1960 DOA: 06/09/2019  PCP: Patient, No Pcp Per  Admit date: 06/09/2019 Discharge date: 06/13/2019  Admitted From: Home Disposition:  Home  Discharge Condition:Stable CODE STATUS:FULL Diet recommendation: Heart Healthy  Brief/Interim Summary: Patient is a 59 year old female with no significant past medical history except for seasonal allergies who presented to the emergency department on 5/21 with complaints of progressive headache, headache, congestion, dry cough, shortness of breath.  She was diagnosed with Covid at urgent care at [facility].  On presentation she was afebrile, mildly tachypneic, tachycardic, hypertensive.  Her saturations were 88% on room air.  Lab work showed minimally elevated liver enzyme, elevated CRP, fibrinogen.  Chest x-ray did not show pneumonia.  Was admitted for the management of Covid illness and was admitted because she was hypoxic on room air.  Started on Decadron and remdesivir.  Her respiratory status continued to improve and today she is on room air.  She finished her 5 days course of remdesivir.  Patient is hemodynamically stable for discharge to home today with oral Decadron for 5 more days.  Following problems were addressed during her hospitalization:  Acute respiratory failure with hypoxia: Due to Covid illness.  This morning she was maintaining her saturation on room air but   She feels better today.  Covid infection: Chest x-ray on presentation did not show any pneumonia.  She was hypoxic on room air on presentation, today she is on room air.  Inflammatory markers continue to improve.   Finish the course of remdesivir, will be discharged on oral Decadron. Continue vitamin C, zinc.  DVT prophylaxis with Lovenox.   Elevated blood pressure: No history of hypertension.  Started on amlodipine.  Normotensive now.  Hyperkalemia:Resolved  Obesity: BMI of 43.5.   Discharge Diagnoses:   Principal Problem:   COVID-19 virus infection Active Problems:   Acute respiratory failure with hypoxia [facility])    Discharge Instructions  Discharge Instructions    Diet - low sodium heart healthy   Complete by: As directed    Discharge instructions   Complete by: As directed    1)Please take prescribed medication as instructed. 2)Follow up with your PCP in 2 weeks. 3)Isolate yourself for next 2 weeks. 4)We have started you on amlodipine 5 mg daily for high blood pressure.  Monitor your blood pressure at home. 5)Infection Prevention Recommendations for Individuals Confirmed to have, or Being Evaluated for, 2019 Novel Coronavirus (COVID-19) Infection Who Receive Care at Home  Individuals who are confirmed to have, or are being evaluated for, COVID-19 should follow the prevention steps below until a healthcare provider or local or state health department says they can return to normal activities.  Stay home except to get medical care You should restrict activities outside your home, except for getting medical care. Do not go to work, school, or public areas, and do not use public transportation or taxis.  Call ahead before visiting your doctor Before your medical appointment, call the healthcare provider and tell them that you have, or are being evaluated for, COVID-19 infection. This will help the healthcare provider's office take steps to keep other people from getting infected. Ask your healthcare provider to call the local or state health department.  Monitor your symptoms Seek prompt medical attention if your illness is worsening (e.g., difficulty breathing). Before going to your medical appointment, call the healthcare provider and tell them that you have, or are being evaluated for, COVID-19 infection. Ask your healthcare provider to call  the local or state health department.  Wear a facemask You should wear a facemask that covers your nose and mouth when you are in  the same room with other people and when you visit a healthcare provider. People who live with or visit you should also wear a facemask while they are in the same room with you.  Separate yourself from other people in your home As much as possible, you should stay in a different room from other people in your home. Also, you should use a separate bathroom, if available.  Avoid sharing household items You should not share dishes, drinking glasses, cups, eating utensils, towels, bedding, or other items with other people in your home. After using these items, you should wash them thoroughly with soap and water.  Cover your coughs and sneezes Cover your mouth and nose with a tissue when you cough or sneeze, or you can cough or sneeze into your sleeve. Throw used tissues in a lined trash can, and immediately wash your hands with soap and water for at least 20 seconds or use an alcohol-based hand rub.  Wash your [employer] your hands often and thoroughly with soap and water for at least 20 seconds. You can use an alcohol-based hand sanitizer if soap and water are not available and if your hands are not visibly dirty. Avoid touching your eyes, nose, and mouth with unwashed hands.   Prevention Steps for Caregivers and Household Members of Individuals Confirmed to have, or Being Evaluated for, COVID-19 Infection Being Cared for in the Home  If you live with, or provide care at home for, a person confirmed to have, or being evaluated for, COVID-19 infection please follow these guidelines to prevent infection:  Follow healthcare provider's instructions Make sure that you understand and can help the patient follow any healthcare provider instructions for all care.  Provide for the patient's basic needs You should help the patient with basic needs in the home and provide support for getting groceries, prescriptions, and other personal needs.  Monitor the patient's symptoms If they  are getting sicker, call his or her medical provider and tell them that the patient has, or is being evaluated for, COVID-19 infection. This will help the healthcare provider's office take steps to keep other people from getting infected. Ask the healthcare provider to call the local or state health department.  Limit the number of people who have contact with the patient If possible, have only one caregiver for the patient. Other household members should stay in another home or place of residence. If this is not possible, they should stay in another room, or be separated from the patient as much as possible. Use a separate bathroom, if available. Restrict visitors who do not have an essential need to be in the home.  Keep older adults, very young children, and other sick people away from the patient Keep older adults, very young children, and those who have compromised immune systems or chronic health conditions away from the patient. This includes people with chronic heart, lung, or kidney conditions, diabetes, and cancer.  Ensure good ventilation Make sure that shared spaces in the home have good air flow, such as from an air conditioner or an opened window, weather permitting.  Wash your hands often Wash your hands often and thoroughly with soap and water for at least 20 seconds. You can use an alcohol based hand sanitizer if soap and water are not available and if your hands are not visibly  dirty. Avoid touching your eyes, nose, and mouth with unwashed hands. Use disposable paper towels to dry your hands. If not available, use dedicated cloth towels and replace them when they become wet.  Wear a facemask and gloves Wear a disposable facemask at all times in the room and gloves when you touch or have contact with the patient's blood, body fluids, and/or secretions or excretions, such as sweat, saliva, sputum, nasal mucus, vomit, urine, or feces. Ensure the mask fits over your nose  and mouth tightly, and do not touch it during use. Throw out disposable facemasks and gloves after using them. Do not reuse. Wash your hands immediately after removing your facemask and gloves. If your personal clothing becomes contaminated, carefully remove clothing and launder. Wash your hands after handling contaminated clothing. Place all used disposable facemasks, gloves, and other waste in a lined container before disposing them with other household waste. Remove gloves and wash your hands immediately after handling these items.  Do not share dishes, glasses, or other household items with the patient Avoid sharing household items. You should not share dishes, drinking glasses, cups, eating utensils, towels, bedding, or other items with a patient who is confirmed to have, or being evaluated for, COVID-19 infection. After the person uses these items, you should wash them thoroughly with soap and water.  Wash laundry thoroughly Immediately remove and wash clothes or bedding that have blood, body fluids, and/or secretions or excretions, such as sweat, saliva, sputum, nasal mucus, vomit, urine, or feces, on them. Wear gloves when handling laundry from the patient. Read and follow directions on labels of laundry or clothing items and detergent. In general, wash and dry with the warmest temperatures recommended on the label.  Clean all areas the individual has used often Clean all touchable surfaces, such as counters, tabletops, doorknobs, bathroom fixtures, toilets, phones, keyboards, tablets, and bedside tables, every day. Also, clean any surfaces that may have blood, body fluids, and/or secretions or excretions on them. Wear gloves when cleaning surfaces the patient has come in contact with. Use a diluted bleach solution (e.g., dilute bleach with 1 part bleach and 10 parts water) or a household disinfectant with a label that says EPA-registered for coronaviruses. To make a bleach solution at  home, add 1 tablespoon of bleach to 1 quart (4 cups) of water. For a larger supply, add  cup of bleach to 1 gallon (16 cups) of water. Read labels of cleaning products and follow recommendations provided on product labels. Labels contain instructions for safe and effective use of the cleaning product including precautions you should take when applying the product, such as wearing gloves or eye protection and making sure you have good ventilation during use of the product. Remove gloves and wash hands immediately after cleaning.  Monitor yourself for signs and symptoms of illness Caregivers and household members are considered close contacts, should monitor their health, and will be asked to limit movement outside of the home to the extent possible. Follow the monitoring steps for close contacts listed on the symptom monitoring form.    Increase activity slowly   Complete by: As directed      Allergies as of 06/13/2019      Reactions   Morphine And Related Other (See Comments)   "Heart pounds".      Medication List    STOP taking these medications   zinc gluconate 50 MG tablet     TAKE these medications   amLODipine 5 MG tablet Commonly known  as: NORVASC Take 1 tablet (5 mg total) by mouth daily. Start taking on: Jun 14, 2019   ascorbic acid 500 MG tablet Commonly known as: VITAMIN C Take 1 tablet (500 mg total) by mouth daily. Start taking on: Jun 14, 2019   calcium gluconate 500 MG tablet Take 500 mg by mouth daily.   cetirizine 10 MG tablet Commonly known as: ZYRTEC Take 10 mg by mouth daily.   dexamethasone 6 MG tablet Commonly known as: DECADRON Take 1 tablet (6 mg total) by mouth daily. Start taking on: Jun 14, 2019   glucosamine-chondroitin 500-400 MG tablet Take 1 tablet by mouth daily.   ibuprofen 800 MG tablet Commonly known as: ADVIL Take 800 mg by mouth daily.   Magnesium 250 MG Tabs Take 250 mg by mouth daily.   multivitamin with minerals Tabs  tablet Take 1 tablet by mouth daily.   pantoprazole 40 MG tablet Commonly known as: Protonix Take 1 tablet (40 mg total) by mouth daily for 14 days.   pseudoephedrine 30 MG tablet Commonly known as: SUDAFED Take 30 mg by mouth every 4 (four) hours as needed for congestion.   zinc sulfate 220 (50 Zn) MG capsule Take 1 capsule (220 mg total) by mouth daily. Start taking on: Jun 14, 2019       Allergies  Allergen Reactions  . Morphine And Related Other (See Comments)    "Heart pounds".    Consultations:  None   Procedures/Studies: DG Chest Port 1 View  Result Date: 06/09/2019 CLINICAL DATA:  Cough and fatigue with decreased oxygen saturation EXAM: PORTABLE CHEST 1 VIEW COMPARISON:  None. FINDINGS: Lungs are clear. Heart size and pulmonary vascularity are normal. No adenopathy. No bone lesions. IMPRESSION: Lungs clear.  Cardiac silhouette within normal limits. Electronically Signed   By: Bretta Bang III M.D.   On: 06/09/2019 14:15       Subjective: Patient seen and examined at the bedside this morning.  Hemodynamically stable for discharge today.  Discharge Exam: Vitals:   06/12/19 2056 06/13/19 0443  BP: 140/90 (!) 133/92  Pulse: 72 68  Resp: 16   Temp: 97.8 F (36.6 C) 98.1 F (36.7 C)  SpO2: 91% 93%   Vitals:   06/12/19 1145 06/12/19 1654 06/12/19 2056 06/13/19 0443  BP: (!) 146/96  140/90 (!) 133/92  Pulse: 76  72 68  Resp: 16  16   Temp: 98.2 F (36.8 C)  97.8 F (36.6 C) 98.1 F (36.7 C)  TempSrc: Oral  Oral Oral  SpO2: 91% 90% 91% 93%  Weight:    122.6 kg  Height:        General: Pt is alert, awake, not in acute distress Cardiovascular: RRR, S1/S2 +, no rubs, no gallops Respiratory: CTA bilaterally, no wheezing, no rhonchi Abdominal: Soft, NT, ND, bowel sounds + Extremities: no edema, no cyanosis    The results of significant diagnostics from this hospitalization (including imaging, microbiology, ancillary and laboratory) are listed  below for reference.     Microbiology: Recent Results (from the past 240 hour(s))  SARS CORONAVIRUS 2 (TAT 6-24 HRS) Nasopharyngeal Nasopharyngeal Swab     Status: Abnormal   Collection Time: 06/09/19  1:57 PM   Specimen: Nasopharyngeal Swab  Result Value Ref Range Status   SARS Coronavirus 2 POSITIVE (A) NEGATIVE Final    Comment: RESULT CALLED TO, READ BACK BY AND VERIFIED WITH: MICHUDA K, RN AT 0052 ON 06/10/2019 BY SAINVILUS S (NOTE) SARS-CoV-2 target nucleic acids are DETECTED.  The SARS-CoV-2 RNA is generally detectable in upper and lower respiratory specimens during the acute phase of infection. Positive results are indicative of the presence of SARS-CoV-2 RNA. Clinical correlation with patient history and other diagnostic information is  necessary to determine patient infection status. Positive results do not rule out bacterial infection or co-infection with other viruses.  The expected result is Negative. Fact Sheet for Patients: HairSlick.nohttps://www.fda.gov/media/138098/download Fact Sheet for Healthcare Providers: quierodirigir.comhttps://www.fda.gov/media/138095/download This test is not yet approved or cleared by the [place] FDA and  has been authorized for detection and/or diagnosis of SARS-CoV-2 by FDA under an Emergency Use Authorization (EUA). This EUA will remain  in effect (meaning this test can b e used) for the duration of the COVID-19 declaration under Section 564(b)(1) of the Act, 21 U.S.C. section 360bbb-3(b)(1), unless the authorization is terminated or revoked sooner. Performed at [facility], [address] [place], [place] [address]   Blood Culture (routine x 2)     Status: Abnormal   Collection Time: 06/09/19  1:57 PM   Specimen: BLOOD  Result Value Ref Range Status   Specimen Description   Final    BLOOD RIGHT ANTECUBITAL Performed at [facility], [address] [place], [place] [address]    Special Requests   Final    BOTTLES DRAWN  AEROBIC AND ANAEROBIC Blood Culture results may not be optimal due to an excessive volume of blood received in culture bottles Performed at [facility], [address] [place], [place] [address]    Culture  Setup Time   Final    GRAM POSITIVE COCCI AEROBIC BOTTLE ONLY CRITICAL RESULT CALLED TO, READ BACK BY AND VERIFIED WITH: PHARMD N GLOGOVAC [id number] MLM    Culture (A)  Final    STAPHYLOCOCCUS SPECIES (COAGULASE NEGATIVE) THE SIGNIFICANCE OF ISOLATING THIS ORGANISM FROM A SINGLE SET OF BLOOD CULTURES WHEN MULTIPLE SETS ARE DRAWN IS UNCERTAIN. PLEASE NOTIFY THE MICROBIOLOGY DEPARTMENT WITHIN ONE WEEK IF SPECIATION AND SENSITIVITIES ARE REQUIRED. Performed at [facility], [address] [place], [place] [address]    Report Status 06/12/2019 FINAL  Final  Blood Culture ID Panel (Reflexed)     Status: Abnormal   Collection Time: 06/09/19  1:57 PM  Result Value Ref Range Status   Enterococcus species NOT DETECTED NOT DETECTED Final   Listeria monocytogenes NOT DETECTED NOT DETECTED Final   Staphylococcus species DETECTED (A) NOT DETECTED Final    Comment: Methicillin (oxacillin) susceptible coagulase negative staphylococcus. Possible blood culture contaminant (unless isolated from more than one blood culture draw or clinical case suggests pathogenicity). No antibiotic treatment is indicated for blood  culture contaminants. CRITICAL RESULT CALLED TO, READ BACK BY AND VERIFIED WITH: PHARMD N GLOGOVAC [id number] MLM    Staphylococcus aureus (BCID) NOT DETECTED NOT DETECTED Final   Methicillin resistance NOT DETECTED NOT DETECTED Final   Streptococcus species NOT DETECTED NOT DETECTED Final   Streptococcus agalactiae NOT DETECTED NOT DETECTED Final   Streptococcus pneumoniae NOT DETECTED NOT DETECTED Final   Streptococcus pyogenes NOT DETECTED NOT DETECTED Final   Acinetobacter baumannii NOT DETECTED NOT DETECTED Final   Enterobacteriaceae species NOT  DETECTED NOT DETECTED Final   Enterobacter cloacae complex NOT DETECTED NOT DETECTED Final   Escherichia coli NOT DETECTED NOT DETECTED Final   Klebsiella oxytoca NOT DETECTED NOT DETECTED Final   Klebsiella pneumoniae NOT DETECTED NOT DETECTED Final   Proteus species NOT DETECTED NOT DETECTED Final   Serratia  marcescens NOT DETECTED NOT DETECTED Final   Haemophilus influenzae NOT DETECTED NOT DETECTED Final   Neisseria meningitidis NOT DETECTED NOT DETECTED Final   Pseudomonas aeruginosa NOT DETECTED NOT DETECTED Final   Candida albicans NOT DETECTED NOT DETECTED Final   Candida glabrata NOT DETECTED NOT DETECTED Final   Candida krusei NOT DETECTED NOT DETECTED Final   Candida parapsilosis NOT DETECTED NOT DETECTED Final   Candida tropicalis NOT DETECTED NOT DETECTED Final    Comment: Performed at [facility], [address], [place], [place] [address]  Blood Culture (routine x 2)     Status: None (Preliminary result)   Collection Time: 06/09/19  2:02 PM   Specimen: BLOOD  Result Value Ref Range Status   Specimen Description   Final    BLOOD RIGHT HAND Performed at [facility], [address], [place], [place] [address]    Special Requests   Final    BOTTLES DRAWN AEROBIC AND ANAEROBIC Blood Culture results may not be optimal due to an excessive volume of blood received in culture bottles Performed at [facility], [address] [place], [place] [address]    Culture   Final    NO GROWTH 4 DAYS Performed at [facility], [address], [place], [place] [address]    Report Status PENDING  Incomplete  Respiratory Panel by RT PCR (Flu A&B, Covid) - Nasopharyngeal Swab     Status: Abnormal   Collection Time: 06/09/19  5:40 PM   Specimen: Nasopharyngeal Swab  Result Value Ref Range Status   SARS Coronavirus 2 by RT PCR POSITIVE (A) NEGATIVE Final    Comment: RESULT CALLED TO, READ BACK BY AND VERIFIED WITH: J.Princella Ion  [id number] @[phone]  BY V.WILKINS (NOTE) SARS-CoV-2 target nucleic acids are DETECTED. SARS-CoV-2 RNA is generally detectable in upper respiratory specimens  during the acute phase of infection. Positive results are indicative of the presence of the identified virus, but do not rule out bacterial infection or co-infection with other pathogens not detected by the test. Clinical correlation with patient history and other diagnostic information is necessary to determine patient infection status. The expected result is Negative. Fact Sheet for Patients:  Fact Sheet for Healthcare Providers: https://www.moore.com/ This test is not yet approved or cleared by the https://www.young.biz/ FDA and  has been authorized for detection and/or diagnosis of SARS-CoV-2 by FDA under an Emergency Use Authorization (EUA).  This EUA will remain in effect (meaning this test can be u sed) for the duration of  the COVID-19 declaration under Section 564(b)(1) of the Act, 21 U.S.C. section 360bbb-3(b)(1), unless the authorization is terminated or revoked sooner.    Influenza A by PCR NEGATIVE NEGATIVE Final   Influenza B by PCR NEGATIVE NEGATIVE Final    Comment: (NOTE) The Xpert Xpress SARS-CoV-2/FLU/RSV assay is intended as an aid in  the diagnosis of influenza from Nasopharyngeal swab specimens and  should not be used as a sole basis for treatment. Nasal washings and  aspirates are unacceptable for Xpert Xpress SARS-CoV-2/FLU/RSV  testing. Fact Sheet for Patients: [place] Fact Sheet for Healthcare Providers: https://www.moore.com/ This test is not yet approved or cleared by the https://www.young.biz/ FDA and  has been authorized for detection and/or diagnosis of SARS-CoV-2 by  FDA under an Emergency Use Authorization (EUA). This EUA will remain  in effect (meaning this test can be used) for the duration of the   Covid-19 declaration under Section 564(b)(1) of the Act,  21  U.S.C. section 360bbb-3(b)(1), unless the authorization is  terminated or revoked. Performed at [facility], [address] [place], [place] [address]      Labs: BNP (last 3 results) No results for input(s): BNP in the last 8760 hours. Basic Metabolic Panel: Recent Labs  Lab 06/09/19 1357 06/09/19 1357 06/10/19 0500 06/11/19 0415 06/11/19 1834 06/12/19 0309 06/13/19 0333  NA 137  --  140 142  --  142 142  K 3.7   < > 4.2 5.2* 4.0 4.4 4.4  CL 103  --  104 104  --  104 106  CO2 28  --  28 28  --  26 27  GLUCOSE 117*  --  170* 153*  --  156* 192*  BUN 14  --  15 18  --  19 22*  CREATININE 0.54  --  0.59 0.70  --  0.66 0.75  CALCIUM 8.4*  --  8.9 8.7*  --  9.2 8.9  MG  --   --  2.0 2.2  --  2.0 2.0  PHOS  --   --  2.3* 3.6  --  3.6 4.0   < > = values in this interval not displayed.   Liver Function Tests: Recent Labs  Lab 06/09/19 1357 06/10/19 0500 06/11/19 0415 06/12/19 0309 06/13/19 0333  AST 48* 38 50* 27 22  ALT 38 36 34 35 32  ALKPHOS 31* 29* 26* 32* 32*  BILITOT 0.4 0.4 1.4* 0.5 0.6  PROT 6.5 6.5 6.4* 7.0 6.7  ALBUMIN 3.4* 3.3* 3.3* 3.7 3.5   No results for input(s): LIPASE, AMYLASE in the last 168 hours. No results for input(s): AMMONIA in the last 168 hours. CBC: Recent Labs  Lab 06/09/19 1357 06/10/19 0500 06/11/19 0415 06/12/19 0309 06/13/19 0333  WBC 6.2 5.1 7.1 9.3 9.4  NEUTROABS 4.8 4.0 5.4 6.7 6.4  HGB 12.2 12.6 13.0 14.2 13.9  HCT 38.9 39.9 42.6 45.3 44.4  MCV 91.7 90.5 93.6 89.7 89.9  PLT 196 203 198 279 324   Cardiac Enzymes: No results for input(s): CKTOTAL, CKMB, CKMBINDEX, TROPONINI in the last 168 hours. BNP: Invalid input(s): POCBNP CBG: No results for input(s): GLUCAP in the last 168 hours. D-Dimer Recent Labs    06/12/19 0309 06/13/19 0333  DDIMER 0.68* 0.50   Hgb A1c No results for input(s): HGBA1C in the last 72 hours. Lipid  Profile No results for input(s): CHOL, HDL, LDLCALC, TRIG, CHOLHDL, LDLDIRECT in the last 72 hours. Thyroid function studies No results for input(s): TSH, T4TOTAL, T3FREE, THYROIDAB in the last 72 hours.  Invalid input(s): FREET3 Anemia work up [employer]    06/12/19 0309 06/13/19 0333  FERRITIN 262 217   Urinalysis No results found for: COLORURINE, APPEARANCEUR, LABSPEC, PHURINE, GLUCOSEU, HGBUR, BILIRUBINUR, KETONESUR, PROTEINUR, UROBILINOGEN, NITRITE, LEUKOCYTESUR Sepsis Labs Invalid input(s): PROCALCITONIN,  WBC,  LACTICIDVEN Microbiology Recent Results (from the past 240 hour(s))  SARS CORONAVIRUS 2 (TAT 6-24 HRS) Nasopharyngeal Nasopharyngeal Swab     Status: Abnormal   Collection Time: 06/09/19  1:57 PM   Specimen: Nasopharyngeal Swab  Result Value Ref Range Status   SARS Coronavirus 2 POSITIVE (A) NEGATIVE Final    Comment: RESULT CALLED TO, READ BACK BY AND VERIFIED WITH: MICHUDA K, RN AT 0052 ON 06/10/2019 BY SAINVILUS S (NOTE) SARS-CoV-2 target nucleic acids are DETECTED. The SARS-CoV-2 RNA is generally detectable in upper and lower respiratory specimens during the acute phase of infection. Positive results are indicative of the  presence of SARS-CoV-2 RNA. Clinical correlation with patient history and other diagnostic information is  necessary to determine patient infection status. Positive results do not rule out bacterial infection or co-infection with other viruses.  The expected result is Negative. Fact Sheet for Patients: HairSlick.no Fact Sheet for Healthcare Providers: quierodirigir.com This test is not yet approved or cleared by the [place] FDA and  has been authorized for detection and/or diagnosis of SARS-CoV-2 by FDA under an Emergency Use Authorization (EUA). This EUA will remain  in effect (meaning this test can b e used) for the duration of the COVID-19 declaration under Section 564(b)(1)  of the Act, 21 U.S.C. section 360bbb-3(b)(1), unless the authorization is terminated or revoked sooner. Performed at [facility], [address], [place], [place] [address]   Blood Culture (routine x 2)     Status: Abnormal   Collection Time: 06/09/19  1:57 PM   Specimen: BLOOD  Result Value Ref Range Status   Specimen Description   Final    BLOOD RIGHT ANTECUBITAL Performed at [facility], [address], [place], [place] [address]    Special Requests   Final    BOTTLES DRAWN AEROBIC AND ANAEROBIC Blood Culture results may not be optimal due to an excessive volume of blood received in culture bottles Performed at [facility], [address] [place], [place] [address]    Culture  Setup Time   Final    GRAM POSITIVE COCCI AEROBIC BOTTLE ONLY CRITICAL RESULT CALLED TO, READ BACK BY AND VERIFIED WITH: PHARMD N GLOGOVAC [id number] MLM    Culture (A)  Final    STAPHYLOCOCCUS SPECIES (COAGULASE NEGATIVE) THE SIGNIFICANCE OF ISOLATING THIS ORGANISM FROM A SINGLE SET OF BLOOD CULTURES WHEN MULTIPLE SETS ARE DRAWN IS UNCERTAIN. PLEASE NOTIFY THE MICROBIOLOGY DEPARTMENT WITHIN ONE WEEK IF SPECIATION AND SENSITIVITIES ARE REQUIRED. Performed at [facility], [address], [place], [place] [address]    Report Status 06/12/2019 FINAL  Final  Blood Culture ID Panel (Reflexed)     Status: Abnormal   Collection Time: 06/09/19  1:57 PM  Result Value Ref Range Status   Enterococcus species NOT DETECTED NOT DETECTED Final   Listeria monocytogenes NOT DETECTED NOT DETECTED Final   Staphylococcus species DETECTED (A) NOT DETECTED Final    Comment: Methicillin (oxacillin) susceptible coagulase negative staphylococcus. Possible blood culture contaminant (unless isolated from more than one blood culture draw or clinical case suggests pathogenicity). No antibiotic treatment is indicated for blood  culture contaminants. CRITICAL RESULT CALLED TO,  READ BACK BY AND VERIFIED WITH: PHARMD N GLOGOVAC [id number] MLM    Staphylococcus aureus (BCID) NOT DETECTED NOT DETECTED Final   Methicillin resistance NOT DETECTED NOT DETECTED Final   Streptococcus species NOT DETECTED NOT DETECTED Final   Streptococcus agalactiae NOT DETECTED NOT DETECTED Final   Streptococcus pneumoniae NOT DETECTED NOT DETECTED Final   Streptococcus pyogenes NOT DETECTED NOT DETECTED Final   Acinetobacter baumannii NOT DETECTED NOT DETECTED Final   Enterobacteriaceae species NOT DETECTED NOT DETECTED Final   Enterobacter cloacae complex NOT DETECTED NOT DETECTED Final   Escherichia coli NOT DETECTED NOT DETECTED Final   Klebsiella oxytoca NOT DETECTED NOT DETECTED Final   Klebsiella pneumoniae NOT DETECTED NOT DETECTED Final   Proteus species NOT DETECTED NOT DETECTED Final   Serratia marcescens NOT DETECTED NOT DETECTED Final   Haemophilus influenzae NOT DETECTED NOT DETECTED Final   Neisseria meningitidis NOT DETECTED NOT DETECTED Final  Pseudomonas aeruginosa NOT DETECTED NOT DETECTED Final   Candida albicans NOT DETECTED NOT DETECTED Final   Candida glabrata NOT DETECTED NOT DETECTED Final   Candida krusei NOT DETECTED NOT DETECTED Final   Candida parapsilosis NOT DETECTED NOT DETECTED Final   Candida tropicalis NOT DETECTED NOT DETECTED Final    Comment: Performed at [facility], [address] [place], [place] [address]  Blood Culture (routine x 2)     Status: None (Preliminary result)   Collection Time: 06/09/19  2:02 PM   Specimen: BLOOD  Result Value Ref Range Status   Specimen Description   Final    BLOOD RIGHT HAND Performed at [facility], [address] [place], [place] [address]    Special Requests   Final    BOTTLES DRAWN AEROBIC AND ANAEROBIC Blood Culture results may not be optimal due to an excessive volume of blood received in culture bottles Performed at [facility], [address], [place], [place] [address]    Culture   Final    NO GROWTH 4 DAYS Performed at [facility], [address], [place], [place] [address]    Report Status PENDING  Incomplete  Respiratory Panel by RT PCR (Flu A&B, Covid) - Nasopharyngeal Swab     Status: Abnormal   Collection Time: 06/09/19  5:40 PM   Specimen: Nasopharyngeal Swab  Result Value Ref Range Status   SARS Coronavirus 2 by RT PCR POSITIVE (A) NEGATIVE Final    Comment: RESULT CALLED TO, READ BACK BY AND VERIFIED WITH: J.Princella Ion [id number]  BY V.WILKINS (NOTE) SARS-CoV-2 target nucleic acids are DETECTED. SARS-CoV-2 RNA is generally detectable in upper respiratory specimens  during the acute phase of infection. Positive results are indicative of the presence of the identified virus, but do not rule out bacterial infection or co-infection with other pathogens not detected by the test. Clinical correlation with patient history and other diagnostic information is necessary to determine patient infection status. The expected result is Negative. Fact Sheet for Patients:  https://www.moore.com/ Fact Sheet for Healthcare Providers: https://www.young.biz/ This test is not yet approved or cleared by the [place] FDA and  has been authorized for detection and/or diagnosis of SARS-CoV-2 by FDA under an Emergency Use Authorization (EUA).  This EUA will remain in effect (meaning this test can be u sed) for the duration of  the COVID-19 declaration under Section 564(b)(1) of the Act, 21 U.S.C. section 360bbb-3(b)(1), unless the authorization is terminated or revoked sooner.    Influenza A by PCR NEGATIVE NEGATIVE Final   Influenza B by PCR NEGATIVE NEGATIVE Final    Comment: (NOTE) The Xpert Xpress SARS-CoV-2/FLU/RSV assay is intended as an aid in  the diagnosis of influenza from Nasopharyngeal swab specimens and  should not be used as a sole basis for treatment. Nasal  washings and  aspirates are unacceptable for Xpert Xpress SARS-CoV-2/FLU/RSV  testing. Fact Sheet for Patients: https://www.moore.com/ Fact Sheet for Healthcare Providers: https://www.young.biz/ This test is not yet approved or cleared by the [place] FDA and  has been authorized for detection and/or diagnosis of SARS-CoV-2 by  FDA under an Emergency Use Authorization (EUA). This EUA will remain  in effect (meaning this test can be used) for the duration of the  Covid-19 declaration under Section 564(b)(1) of the Act, 21  U.S.C. section 360bbb-3(b)(1), unless the authorization is  terminated or revoked. Performed at [facility], [address], [place], [place] [address]  Please note: You were cared for by a hospitalist during your hospital stay. Once you are discharged, your primary care physician will handle any further medical issues. Please note that NO REFILLS for any discharge medications will be authorized once you are discharged, as it is imperative that you return to your primary care physician (or establish a relationship with a primary care physician if you do not have one) for your post hospital discharge needs so that they can reassess your need for medications and monitor your lab values.    Time coordinating discharge: 40 minutes  SIGNED:   Burnadette Pop, MD  [facility] 06/13/2019, 11:20 AM Pager [phone]  If 7PM-7AM, please contact night-coverage [email or web address] Password [email or web address]

## 2019-06-13 NOTE — Progress Notes (Signed)
Patient discharged home.  IV removed - WNL.  Reviewed AVS and medications.  Instructed to follow up with PCP in 2 weeks.  Educated on COVID guidelines.  Patient verbalizes understanding.  No questions at this time.  Patient in NAD, awaiting arrival of ride.

## 2019-06-13 NOTE — Discharge Instructions (Signed)

## 2019-06-14 ENCOUNTER — Encounter (INDEPENDENT_AMBULATORY_CARE_PROVIDER_SITE_OTHER): Payer: Self-pay

## 2019-06-14 LAB — CULTURE, BLOOD (ROUTINE X 2): Culture: NO GROWTH

## 2019-06-15 ENCOUNTER — Encounter (INDEPENDENT_AMBULATORY_CARE_PROVIDER_SITE_OTHER): Payer: Self-pay

## 2019-06-16 ENCOUNTER — Encounter (INDEPENDENT_AMBULATORY_CARE_PROVIDER_SITE_OTHER): Payer: Self-pay

## 2019-06-17 ENCOUNTER — Encounter (INDEPENDENT_AMBULATORY_CARE_PROVIDER_SITE_OTHER): Payer: Self-pay

## 2019-06-18 ENCOUNTER — Encounter (INDEPENDENT_AMBULATORY_CARE_PROVIDER_SITE_OTHER): Payer: Self-pay

## 2019-06-19 ENCOUNTER — Encounter (INDEPENDENT_AMBULATORY_CARE_PROVIDER_SITE_OTHER): Payer: Self-pay

## 2019-06-20 ENCOUNTER — Encounter (INDEPENDENT_AMBULATORY_CARE_PROVIDER_SITE_OTHER): Payer: Self-pay

## 2019-06-21 ENCOUNTER — Encounter (INDEPENDENT_AMBULATORY_CARE_PROVIDER_SITE_OTHER): Payer: Self-pay

## 2019-06-21 DIAGNOSIS — R5383 Other fatigue: Secondary | ICD-10-CM | POA: Diagnosis not present

## 2019-06-21 DIAGNOSIS — R5381 Other malaise: Secondary | ICD-10-CM | POA: Diagnosis not present

## 2019-06-21 DIAGNOSIS — I10 Essential (primary) hypertension: Secondary | ICD-10-CM | POA: Diagnosis not present

## 2019-06-21 DIAGNOSIS — M792 Neuralgia and neuritis, unspecified: Secondary | ICD-10-CM | POA: Diagnosis not present

## 2019-06-21 DIAGNOSIS — Z8616 Personal history of COVID-19: Secondary | ICD-10-CM | POA: Diagnosis not present

## 2019-06-21 DIAGNOSIS — Z1331 Encounter for screening for depression: Secondary | ICD-10-CM | POA: Diagnosis not present

## 2019-06-21 DIAGNOSIS — E559 Vitamin D deficiency, unspecified: Secondary | ICD-10-CM | POA: Diagnosis not present

## 2019-06-22 ENCOUNTER — Telehealth: Payer: Self-pay | Admitting: *Deleted

## 2019-06-22 ENCOUNTER — Encounter (INDEPENDENT_AMBULATORY_CARE_PROVIDER_SITE_OTHER): Payer: Self-pay

## 2019-06-22 NOTE — Telephone Encounter (Signed)
Received BPA due to pt's complaint of cough; contacted the pt; the pt says that she went out yesterday, and thinks that she over did it; the pt says that she had her 1st follow up visit on 06/21/19;she says that she did not get her prescription for cough medication filled, but she will have it transferred to [facility] near her sister's house;  recommendations given per nurse triage protocol:  : COUGH MEDICINES:  * OTC COUGH DROPS: Cough drops can help a lot, especially for mild coughs. They reduce coughing by soothing your irritated throat and removing that tickle sensation in the back of the throat. Cough drops also have the advantage of portability - you can carry them with you.  * HOME REMEDY - HARD CANDY: Hard candy works just as well as medicine-flavored OTC cough drops. People who have diabetes should use sugar-free candy.  * HOME REMEDY - HONEY: This old home remedy has been shown to help decrease coughing at night. The adult dosage is 2 teaspoons (10 ml) at bedtime. Honey should not be given to infants under one year of age.  she verbalized understanding.

## 2019-06-23 ENCOUNTER — Encounter (INDEPENDENT_AMBULATORY_CARE_PROVIDER_SITE_OTHER): Payer: Self-pay

## 2019-06-24 ENCOUNTER — Encounter (INDEPENDENT_AMBULATORY_CARE_PROVIDER_SITE_OTHER): Payer: Self-pay

## 2019-06-25 ENCOUNTER — Encounter (INDEPENDENT_AMBULATORY_CARE_PROVIDER_SITE_OTHER): Payer: Self-pay

## 2019-06-26 ENCOUNTER — Encounter (INDEPENDENT_AMBULATORY_CARE_PROVIDER_SITE_OTHER): Payer: Self-pay

## 2019-06-26 ENCOUNTER — Telehealth: Payer: Self-pay | Admitting: *Deleted

## 2019-06-26 NOTE — Telephone Encounter (Addendum)
Pt called, no answer. Left message for patient to return call to discuss symptom of worsening appetite. MyChart message sent to patient with care advice for worsening appetite.

## 2019-06-28 DIAGNOSIS — G44209 Tension-type headache, unspecified, not intractable: Secondary | ICD-10-CM | POA: Diagnosis not present

## 2019-06-28 DIAGNOSIS — I10 Essential (primary) hypertension: Secondary | ICD-10-CM | POA: Diagnosis not present

## 2019-06-28 DIAGNOSIS — R600 Localized edema: Secondary | ICD-10-CM | POA: Diagnosis not present

## 2019-08-21 ENCOUNTER — Encounter: Payer: Self-pay | Admitting: Orthopaedic Surgery

## 2019-08-21 ENCOUNTER — Ambulatory Visit: Payer: BC Managed Care – PPO | Admitting: Orthopaedic Surgery

## 2019-08-21 ENCOUNTER — Other Ambulatory Visit: Payer: Self-pay

## 2019-08-21 ENCOUNTER — Ambulatory Visit: Payer: Self-pay

## 2019-08-21 VITALS — Ht 66.0 in

## 2019-08-21 DIAGNOSIS — Z96641 Presence of right artificial hip joint: Secondary | ICD-10-CM | POA: Diagnosis not present

## 2019-08-21 DIAGNOSIS — Z96642 Presence of left artificial hip joint: Secondary | ICD-10-CM | POA: Diagnosis not present

## 2019-08-21 DIAGNOSIS — M25551 Pain in right hip: Secondary | ICD-10-CM

## 2019-08-21 MED ORDER — LIDOCAINE HCL 1 % IJ SOLN
3.0000 mL | INTRAMUSCULAR | Status: AC | PRN
  Administered 2019-08-21: 3 mL

## 2019-08-21 MED ORDER — METHYLPREDNISOLONE ACETATE 40 MG/ML IJ SUSP
40.0000 mg | INTRAMUSCULAR | Status: AC | PRN
  Administered 2019-08-21: 40 mg via INTRA_ARTICULAR

## 2019-08-21 NOTE — Progress Notes (Signed)
Office Visit Note   Patient: Tracy Orozco           Date of Birth: October 21, 1960           MRN: [id number] Visit Date: 08/21/2019              Requested by: No referring provider defined for this encounter. PCP: Patient, No Pcp Per   Assessment & Plan: Visit Diagnoses:  1. Pain in right hip   2. History of total replacement of right hip   3. History of total left hip replacement     Plan: I did recommend a steroid injection of her right hip trochanteric area and she agreed with this.  She tolerated well.  She will work on stretching exercises and can still ambulate using a cane until she is pain-free.  She does understand that it would be worthwhile finding out what implants were put in her in terms of the company and products.  She is going to need at some point a left hip revision of at least the liner or potentially even the acetabular component and obviously the hip ball as well.  There is significant superior wear of the acetabular liner that she will likely develop pain in the left hip at some point and potential loosening of the components.  Since she is pain-free, we can see her back at 1 year with a standing low AP pelvis and lateral of the left hip.  If there is any issues before then she will let us know.  All questions and concerns were answered and addressed.  Follow-Up Instructions: Return in about 1 year (around 08/20/2020).   Orders:  Orders Placed This Encounter  Procedures  . Large Joint Inj  . XR HIP UNILAT W OR W/O PELVIS 2-3 VIEWS RIGHT   No orders of the defined types were placed in this encounter.     Procedures: Large Joint Inj: R greater trochanter on 08/21/2019 10:25 AM Indications: pain and diagnostic evaluation Details: 22 G 1.5 in needle, lateral approach  Arthrogram: No  Medications: 3 mL lidocaine 1 %; 40 mg methylPREDNISolone acetate 40 MG/ML Outcome: tolerated well, no immediate complications Procedure, treatment alternatives, risks and benefits  explained, specific risks discussed. Consent was given by the patient. Immediately prior to procedure a time out was called to verify the correct patient, procedure, equipment, support staff and site/side marked as required. Patient was prepped and draped in the usual sterile fashion.       Clinical Data: No additional findings.   Subjective: Chief Complaint  Patient presents with  . Right Hip - Pain  Patient comes in today for evaluation treatment of acute right hip pain.  She does have a history of bilateral hip replacements of the right hip replaced in two thousand nine but her left hip replaced in two thousand.  They were done by the same surgeon but obviously 9 years apart in different implants.  She is someone who is relocated to [facility].  She fell on July 4 landing directly on her right hip.  She is had pain in the groin and mainly on the lateral aspect of the right hip since then with difficulty walking.  She is using a cane and that has taken pressure off of her hip.  Prior to this mechanical fall, she denied any type of pain at all with either hip.  Her left hip is still pain-free.  She is otherwise an active individual.  She is taking ibuprofen  for pain.  She is someone who is considered morbidly obese.  I have operated on her sister before.  She denies any other acute changes in her medical status.  HPI  Review of Systems She currently denies any headache, chest pain, shortness of breath, fever, chills, nausea, vomiting  Objective: Vital Signs: Ht 5\' 6"  (1.676 m)   BMI 43.63 kg/m   Physical Exam She is alert and orient x3 and in no acute distress Ortho Exam Examination of both hips shows the move smoothly and fluidly.  Examination of her right hip shows significant pain over the proximal trochanteric area and IT band proximally. Specialty Comments:  No specialty comments available.  Imaging: XR HIP UNILAT W OR W/O PELVIS 2-3 VIEWS RIGHT  Result Date:  08/21/2019 An AP pelvis and lateral the right hip shows bilateral total hip arthroplasties.  There is no evidence of an acute fracture or any injury.  There is chronic changes in the left hip in terms of polyliner wear.  The femoral head is in a more eccentric position superiorly showing polywear.  There is no evidence of loosening of the acetabular or femoral component.  The femoral component is cemented.  The right side has a press-fit components.  It appears that the femoral head is quite large as if there is a metal-on-metal articulation.  There is no evidence of loosening or gross findings or acute findings around the hip.    PMFS History: Patient Active Problem List   Diagnosis Date Noted  . COVID-19 virus infection 06/09/2019  . Acute respiratory failure with hypoxia (HCC) 06/09/2019   Past Medical History:  Diagnosis Date  . Arthritis     Family History  Problem Relation Age of Onset  . Hypertension Mother   . CAD Mother   . Hypertension Father   . CAD Father   . Diabetes Brother     Past Surgical History:  Procedure Laterality Date  . BACK SURGERY    . JOINT REPLACEMENT     Bilateral hip replacements.   Social History   Occupational History  . Not on file  Tobacco Use  . Smoking status: Not on file  Substance and Sexual Activity  . Alcohol use: Never  . Drug use: Never  . Sexual activity: Not on file

## 2019-08-28 ENCOUNTER — Ambulatory Visit: Payer: BC Managed Care – PPO | Admitting: Orthopaedic Surgery

## 2020-06-02 ENCOUNTER — Ambulatory Visit: Payer: BC Managed Care – PPO | Admitting: Orthopaedic Surgery

## 2020-06-09 ENCOUNTER — Ambulatory Visit (INDEPENDENT_AMBULATORY_CARE_PROVIDER_SITE_OTHER): Payer: 59 | Admitting: Orthopaedic Surgery

## 2020-06-09 ENCOUNTER — Ambulatory Visit: Payer: Self-pay

## 2020-06-09 VITALS — Ht 66.0 in | Wt 251.0 lb

## 2020-06-09 DIAGNOSIS — G8929 Other chronic pain: Secondary | ICD-10-CM

## 2020-06-09 DIAGNOSIS — M7051 Other bursitis of knee, right knee: Secondary | ICD-10-CM

## 2020-06-09 DIAGNOSIS — M25561 Pain in right knee: Secondary | ICD-10-CM | POA: Diagnosis not present

## 2020-06-09 DIAGNOSIS — M1711 Unilateral primary osteoarthritis, right knee: Secondary | ICD-10-CM | POA: Diagnosis not present

## 2020-06-09 MED ORDER — METHYLPREDNISOLONE ACETATE 40 MG/ML IJ SUSP
40.0000 mg | INTRAMUSCULAR | Status: AC | PRN
  Administered 2020-06-09: 40 mg via INTRA_ARTICULAR

## 2020-06-09 MED ORDER — LIDOCAINE HCL 1 % IJ SOLN
3.0000 mL | INTRAMUSCULAR | Status: AC | PRN
  Administered 2020-06-09: 3 mL

## 2020-06-09 NOTE — Progress Notes (Signed)
Office Visit Note   Patient: Tracy Orozco           Date of Birth: 15-Oct-1960           MRN: [id number] Visit Date: 06/09/2020              Requested by: No referring provider defined for this encounter. PCP: Patient, No Pcp Per (Inactive)   Assessment & Plan: Visit Diagnoses:  1. Chronic pain of right knee   2. Unilateral primary osteoarthritis, right knee   3. Pes anserinus bursitis of right knee     Plan: I felt it was worthwhile trying an intra-articular steroid injection in that right knee as well as a steroid injection on the pes bursa.  She agreed to this and tolerated it well.  I also recommended Voltaren gel to try as a topical anti-inflammatory over the medial aspect of her knee including the pes bursa.  She will keep her follow-up appointment in July or will have a repeat low AP pelvis and lateral of her left hip since that is the one that the liner is wearing out on more.  All questions and concerns were answered and addressed.  She is to still going to try to see if she can find out what implants were used for her hip replacement.  Follow-Up Instructions: Return in about 2 months (around 08/09/2020).   Orders:  Orders Placed This Encounter  Procedures  . Large Joint Inj  . XR Knee 1-2 Views Right   No orders of the defined types were placed in this encounter.     Procedures: Large Joint Inj: R knee on 06/09/2020 4:25 PM Indications: diagnostic evaluation and pain Details: 22 G 1.5 in needle, superolateral approach  Arthrogram: No  Medications: 3 mL lidocaine 1 %; 40 mg methylPREDNISolone acetate 40 MG/ML Outcome: tolerated well, no immediate complications Procedure, treatment alternatives, risks and benefits explained, specific risks discussed. Consent was given by the patient. Immediately prior to procedure a time out was called to verify the correct patient, procedure, equipment, support staff and site/side marked as required. Patient was prepped and draped in  the usual sterile fashion.       Clinical Data: No additional findings.   Subjective: Chief Complaint  Patient presents with  . Right Knee - Pain  The patient is actually well-known to me.  I saw her in July of last year when she had moved to our state.  I have done surgery on her sister.  The patient herself has a history of bilateral hip replacements that were done over 20 years ago.  The left hip we have been concerned about due to polyethylene liner wear.  She comes in today though with right knee pain is been hurting for the last several months now.  She points to the lateral compartment of the knee as well as the pes bursa area of the knee as a source of pain.  Sometimes that knee feels like it is giving out on her.  She was hospitalized for COVID last year.  She says occasionally she has some short-term memory issues.  Her BMI is down to 40.51.  She is not a diabetic.  HPI  Review of Systems Today there is no listed headache, chest pain, shortness of breath, fever, chills, nausea, vomiting  Objective: Vital Signs: Ht 5\' 6"  (1.676 m)   Wt 251 lb (113.9 kg)   BMI 40.51 kg/m   Physical Exam She is alert and orient x3 and  in no acute distress Ortho Exam Examination of her right knee shows pain over the pes bursa and the lateral compartment the knee and some on the medial compartment.  The knee moves well and feels ligamentously stable but there is obviously tenderness and discomfort. Specialty Comments:  No specialty comments available.  Imaging: XR Knee 1-2 Views Right  Result Date: 06/09/2020 2 views of the right knee show no acute findings.  There is moderate arthritic changes in the lateral compartment and the patellofemoral joint.    PMFS History: Patient Active Problem List   Diagnosis Date Noted  . COVID-19 virus infection 06/09/2019  . Acute respiratory failure with hypoxia (HCC) 06/09/2019   Past Medical History:  Diagnosis Date  . Arthritis     Family  History  Problem Relation Age of Onset  . Hypertension Mother   . CAD Mother   . Hypertension Father   . CAD Father   . Diabetes Brother     Past Surgical History:  Procedure Laterality Date  . BACK SURGERY    . JOINT REPLACEMENT     Bilateral hip replacements.   Social History   Occupational History  . Not on file  Tobacco Use  . Smoking status: Not on file  . Smokeless tobacco: Not on file  Substance and Sexual Activity  . Alcohol use: Never  . Drug use: Never  . Sexual activity: Not on file

## 2020-08-20 ENCOUNTER — Ambulatory Visit: Payer: BC Managed Care – PPO | Admitting: Orthopaedic Surgery

## 2020-08-20 ENCOUNTER — Ambulatory Visit (INDEPENDENT_AMBULATORY_CARE_PROVIDER_SITE_OTHER): Payer: 59

## 2020-08-20 ENCOUNTER — Other Ambulatory Visit: Payer: Self-pay

## 2020-08-20 ENCOUNTER — Ambulatory Visit (INDEPENDENT_AMBULATORY_CARE_PROVIDER_SITE_OTHER): Payer: 59 | Admitting: Orthopaedic Surgery

## 2020-08-20 VITALS — Ht 65.75 in | Wt 252.8 lb

## 2020-08-20 DIAGNOSIS — Z96641 Presence of right artificial hip joint: Secondary | ICD-10-CM | POA: Diagnosis not present

## 2020-08-20 DIAGNOSIS — M25552 Pain in left hip: Secondary | ICD-10-CM

## 2020-08-20 DIAGNOSIS — M25561 Pain in right knee: Secondary | ICD-10-CM | POA: Diagnosis not present

## 2020-08-20 DIAGNOSIS — Z96642 Presence of left artificial hip joint: Secondary | ICD-10-CM

## 2020-08-20 DIAGNOSIS — G8929 Other chronic pain: Secondary | ICD-10-CM

## 2020-08-20 NOTE — Addendum Note (Signed)
Addended by: Rogers Seeds on: 08/20/2020 05:04 PM   Modules accepted: Orders

## 2020-08-20 NOTE — Progress Notes (Signed)
Office Visit Note   Patient: Tracy Orozco           Date of Birth: 05-01-1960           MRN: [id number] Visit Date: 08/20/2020              Requested by: No referring provider defined for this encounter. PCP: Patient, No Pcp Per (Inactive)   Assessment & Plan: Visit Diagnoses:  1. Pain in left hip   2. History of total replacement of right hip   3. History of total left hip replacement   4. Chronic pain of right knee     Plan: At this point she has failed conservative treatment for her right knee for well over 12 months.  A MRI of the right knee is warranted to assess the cartilage and to rule out a meniscal tear.  At some point we do need to find out the components of her left hip because she will definitely be in need of a hip ball and polyliner exchange.  We will see her in follow-up after her right knee MRI.  Follow-Up Instructions: No follow-ups on file.   Orders:  Orders Placed This Encounter  Procedures   XR HIP UNILAT W OR W/O PELVIS 2-3 VIEWS LEFT   No orders of the defined types were placed in this encounter.     Procedures: No procedures performed   Clinical Data: No additional findings.   Subjective: Chief Complaint  Patient presents with   Left Hip - Pain   Right Knee - Pain  The patient is a 60 year old female well-known to me.  She has a remote history of bilateral hips being replaced many years ago.  This was done elsewhere.  She has been having some chronic left hip pain and known polywear in that left hip.  I have seen her for her right knee as well.  Her right knee shows some valgus malalignment and the joint space is not look severely worn but she is still having severe right knee pain with locking and catching.  She has tried and failed conservative treatment including rest, anti-inflammatories, weight loss, quad training exercises and even steroid injections that we have placed in the knee.  The steroid injections did temporize her pain over the  pes bursa and the knee joint but now she is still getting mechanical symptoms and walking now with a significant limp as it relates to her right knee.  She has had no other acute change in her medical status.  She says so far hips are feeling okay.  We are following her closely in terms of the need for polyliner exchange and hip ball exchange of her left hip.  She is also working to find out what her components are.  HPI  Review of Systems There is currently listed no headache, chest pain, shortness of breath, fever, chills, nausea, vomiting  Objective: Vital Signs: Ht 5' 5.75" (1.67 m)   Wt 252 lb 12.8 oz (114.7 kg)   BMI 41.11 kg/m   Physical Exam She is alert and oriented x3 and in no acute distress Ortho Exam Examination of both hips show they are still moving smoothly.  I have her lay in a supine position her leg lengths are equal.  Her right knee does have valgus malalignment and significant pain along the medial lateral joint line and the pes bursa and a positive McMurray's sign to the lateral compartment of the knee. Specialty Comments:  No specialty comments  available.  Imaging: XR HIP UNILAT W OR W/O PELVIS 2-3 VIEWS LEFT  Result Date: 08/20/2020 An AP pelvis and lateral left hip shows a total hip arthroplasty with polyliner wear.  The hip ball is in a superior position within the socket.  It is still located and compared to films from 12 months ago it is not changed significant.  There is also a right total hip arthroplasty.    PMFS History: Patient Active Problem List   Diagnosis Date Noted   COVID-19 virus infection 06/09/2019   Acute respiratory failure with hypoxia (HCC) 06/09/2019   Past Medical History:  Diagnosis Date   Arthritis     Family History  Problem Relation Age of Onset   Hypertension Mother    CAD Mother    Hypertension Father    CAD Father    Diabetes Brother     Past Surgical History:  Procedure Laterality Date   BACK SURGERY     JOINT  REPLACEMENT     Bilateral hip replacements.   Social History   Occupational History   Not on file  Tobacco Use   Smoking status: Not on file   Smokeless tobacco: Not on file  Substance and Sexual Activity   Alcohol use: Never   Drug use: Never   Sexual activity: Not on file

## 2020-08-25 ENCOUNTER — Telehealth: Payer: Self-pay | Admitting: Orthopaedic Surgery

## 2020-08-25 NOTE — Telephone Encounter (Signed)
Received authorization form patient completed to obtain records, IC pt,lmvm for pt to [facility] re: the form.

## 2020-09-06 ENCOUNTER — Ambulatory Visit
Admission: RE | Admit: 2020-09-06 | Discharge: 2020-09-06 | Disposition: A | Discharge status: Home / Self Care | Payer: 59 | Source: Ambulatory Visit | Attending: Orthopaedic Surgery | Admitting: Orthopaedic Surgery

## 2020-09-06 ENCOUNTER — Other Ambulatory Visit: Payer: Self-pay

## 2020-09-06 DIAGNOSIS — G8929 Other chronic pain: Secondary | ICD-10-CM

## 2020-10-22 ENCOUNTER — Ambulatory Visit: Payer: 59 | Admitting: Orthopaedic Surgery

## 2020-10-29 ENCOUNTER — Other Ambulatory Visit: Payer: Self-pay

## 2020-10-29 ENCOUNTER — Ambulatory Visit (INDEPENDENT_AMBULATORY_CARE_PROVIDER_SITE_OTHER): Payer: 59 | Admitting: Orthopaedic Surgery

## 2020-10-29 ENCOUNTER — Encounter: Payer: Self-pay | Admitting: Orthopaedic Surgery

## 2020-10-29 DIAGNOSIS — G8929 Other chronic pain: Secondary | ICD-10-CM | POA: Diagnosis not present

## 2020-10-29 DIAGNOSIS — M25561 Pain in right knee: Secondary | ICD-10-CM

## 2020-10-29 DIAGNOSIS — M1711 Unilateral primary osteoarthritis, right knee: Secondary | ICD-10-CM | POA: Insufficient documentation

## 2020-10-29 NOTE — Progress Notes (Signed)
The patient comes in today to go over MRI of her right knee.  She does have valgus malalignment of the knee and global tenderness with pain and locking and catching.  She is 60 years old.  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  She has tried and failed conservative treatment for her right knee including steroid injections and activity modification.  She is now working on weight loss as well.  She does wear a knee sleeve on occasion for her right knee but he gets hot and she breaks out around that.  The MRI is reviewed with her.  It does show extensive full-thickness cartilage loss of the lateral compartment of her knee and the patellofemoral joint.  There is also an extensive meniscal tear of the lateral meniscus.  She has degeneration of her ACL and PCL.  On exam her right knee does show valgus malalignment with mild effusion and global tenderness especially on the lateral aspect of the knee.  At this point I am recommending knee replacement surgery for her.  I showed her knee replacement model and explained in detail what the surgery involves.  She needs to continue to work on weight loss and activity modification as well as quad strengthening.  She would like to consider surgery potentially in January.  I would like to see her back in early December.  No x-rays are needed but I will need a repeat weight and BMI calculation at that visit.  All questions and concerns were answered and addressed.

## 2021-01-07 ENCOUNTER — Ambulatory Visit (INDEPENDENT_AMBULATORY_CARE_PROVIDER_SITE_OTHER): Payer: 59 | Admitting: Orthopaedic Surgery

## 2021-01-07 ENCOUNTER — Other Ambulatory Visit: Payer: Self-pay

## 2021-01-07 ENCOUNTER — Encounter: Payer: Self-pay | Admitting: Orthopaedic Surgery

## 2021-01-07 VITALS — Ht 65.75 in | Wt 254.0 lb

## 2021-01-07 DIAGNOSIS — G8929 Other chronic pain: Secondary | ICD-10-CM | POA: Diagnosis not present

## 2021-01-07 DIAGNOSIS — M25561 Pain in right knee: Secondary | ICD-10-CM | POA: Diagnosis not present

## 2021-01-07 DIAGNOSIS — M1711 Unilateral primary osteoarthritis, right knee: Secondary | ICD-10-CM | POA: Diagnosis not present

## 2021-01-07 NOTE — Progress Notes (Signed)
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  She comes in today for repeat weight and BMI calculation.  We are hoping at some point to get her scheduled for a right knee replacement.  She understands that we are guided by the hospitals and insurance companies recommendations as it relates to total joint replacement surgery.  We really need to have her BMI below 40.  Today she weighs 254 pounds with a BMI of 41.31.  Unfortunately still cannot schedule surgery and I told her that the complication rate in my hands has been certainly high as it relates to total knee and total hip arthroplasties and BMI is over 40 and it makes me uncomfortable with proceeding with surgery.  She does not have a large soft tissue envelope around her right knee and she is not a diabetic and is not on blood thinning medication.  I totally empathize with her being upset that we cannot schedule her surgery yet.  I would like her to try to lose some more weight so we can show her being on a weight loss journey and be an advocate for her having in the replacement.  I would like to see her back in 4 weeks with a repeat weight and BMI calculation.

## 2021-02-09 ENCOUNTER — Encounter: Payer: Self-pay | Admitting: Orthopaedic Surgery

## 2021-02-09 ENCOUNTER — Ambulatory Visit (INDEPENDENT_AMBULATORY_CARE_PROVIDER_SITE_OTHER): Payer: BC Managed Care – PPO | Admitting: Orthopaedic Surgery

## 2021-02-09 VITALS — Ht 65.75 in | Wt 240.0 lb

## 2021-02-09 DIAGNOSIS — G8929 Other chronic pain: Secondary | ICD-10-CM

## 2021-02-09 DIAGNOSIS — M1711 Unilateral primary osteoarthritis, right knee: Secondary | ICD-10-CM | POA: Diagnosis not present

## 2021-02-09 DIAGNOSIS — M25561 Pain in right knee: Secondary | ICD-10-CM | POA: Diagnosis not present

## 2021-02-09 NOTE — Progress Notes (Signed)
The patient is well-known to me.  She has well-documented severe end-stage arthritis of her right knee and is in need of knee replacement surgery.  We had to hold off on scheduling this due to her BMI being almost 42.  She has worked diligently with weight loss.  Her BMI today is only 39.  Her right knee is not a large knee at all.  There is global tenderness around the knee and patella from capitation down the arc of motion of her right knee.  Her motion is full but is painful.  There is patellofemoral crepitation.  Her right knee is ligaments is stable.  We have discussed in length in detail knee replacement surgery.  Have gone over her x-rays already with her before and talked about the interoperative and postoperative course.  She is not a diabetic.  I agree at this point with scheduling surgery as soon as we can.  She will continue her weight loss journey while we work on getting the surgery scheduled.  She would not be able to drive for about 4 to 6 weeks and will need to be out of work at least 6 to 8 weeks while she participates in therapy for the knee.  It could even be up to 12 weeks depending on how she does postoperatively.  All questions and concerns were answered addressed.  At this point I am comfortable with getting the surgery scheduled given the failure of conservative treatment for over a year combined with her known arthritis on exam and x-rays.

## 2021-02-20 ENCOUNTER — Telehealth: Payer: Self-pay | Admitting: Orthopaedic Surgery

## 2021-02-20 NOTE — Telephone Encounter (Signed)
Received $25.00 cash, medical records release form and medical certification form from patient    Forwarding to [facility] today

## 2021-02-24 ENCOUNTER — Other Ambulatory Visit: Payer: Self-pay

## 2021-03-05 ENCOUNTER — Other Ambulatory Visit: Payer: Self-pay | Admitting: Physician Assistant

## 2021-03-05 DIAGNOSIS — M1711 Unilateral primary osteoarthritis, right knee: Secondary | ICD-10-CM

## 2021-03-18 NOTE — Progress Notes (Addendum)
COVID swab appointment:  Same Day Covid Test, lives out of town  COVID Vaccine Completed:  Yes x2 Date COVID Vaccine completed: Has received booster: Yes x2 COVID vaccine manufacturer: Moderna     Date of COVID positive in last 90 days:  No  PCP - Guerry Bruin, MD Cardiologist - N/A  Chest x-ray -  N/A EKG - 03-19-21 Epic Stress Test -  N/A ECHO -  N/A Cardiac Cath -  N/A Pacemaker/ICD device last checked: Spinal Cord Stimulator:  Bowel Prep - N/A  Sleep Study - N/A CPAP -   Fasting Blood Sugar - N/A Checks Blood Sugar _____ times a day  Blood Thinner Instructions:N/A Aspirin Instructions: Last Dose:  Activity level:   Can go up a flight of stairs and perform activities of daily living without stopping and without symptoms of chest pain or shortness of breath. Some limitations due to joint pain    Anesthesia review:  N/A  Patient denies shortness of breath, fever, cough and chest pain at PAT appointment   Patient verbalized understanding of instructions that were given to them at the PAT appointment. Patient was also instructed that they will need to review over the PAT instructions again at home before surgery.

## 2021-03-18 NOTE — Patient Instructions (Addendum)
DUE TO COVID-19 ONLY ONE VISITOR IS ALLOWED TO COME WITH YOU AND STAY IN THE WAITING ROOM ONLY DURING PRE OP AND PROCEDURE.   **NO VISITORS ARE ALLOWED IN THE SHORT STAY AREA OR RECOVERY ROOM!!**  IF YOU WILL BE ADMITTED INTO THE HOSPITAL YOU ARE ALLOWED ONLY TWO SUPPORT PEOPLE DURING VISITATION HOURS ONLY (7 AM -8PM)    Up to two visitors ages 61+ are allowed at one time in a patient's room.  The visitors may rotate out with other people throughout the day.  Additionally, up to two children between the ages of 14 and 40 are allowed and do not count toward the number of allowed visitors.  Children within this age range must be accompanied by an adult visitor.  One adult visitor may remain with the patient overnight and must be in the room by 8 PM.  You are not required to quarantine, however you are required to wear a well-fitted mask when you are out and around people not in your household.  Hand Hygiene often Do NOT share personal items Notify your provider if you are in close contact with someone who has COVID or you develop fever 100.4 or greater, new onset of sneezing, cough, sore throat, shortness of breath or body aches.   Your procedure is scheduled on: Friday, 03-27-21   Report to [facility] Main  Entrance     Report to admitting at 5:15 AM   Call this number if you have problems the morning of surgery [phone]   Do not eat food :After Midnight.   May have liquids until 5:00 AM  day of surgery  CLEAR LIQUID DIET  Foods Allowed                                                                     Foods Excluded  Water, Black Coffee (no milk/no creamer) and tea, regular and decaf                              liquids that you cannot  Plain Jell-O in any flavor  (No red)                         see through such as: Fruit ices (not with fruit pulp)                                 milk, soups, orange juice  Iced Popsicles (No red)                                    All  solid food                             Apple juices Sports drinks like Gatorade (No red) Lightly seasoned clear broth or consume(fat free) Sugar    Complete one Ensure drink the morning of surgery at 5:00 AM the day of surgery.       The day of surgery:  Drink  ONE (1) Pre-Surgery Clear Ensure the morning of surgery. Drink in one sitting. Do not sip.  This drink was given to you during your hospital  pre-op appointment visit. Nothing else to drink after completing the Pre-Surgery Clear Ensure.          If you have questions, please contact your surgeons office.     Oral Hygiene is also important to reduce your risk of infection.                                    Remember - BRUSH YOUR TEETH THE MORNING OF SURGERY WITH YOUR REGULAR TOOTHPASTE   Do NOT smoke after Midnight   Take these medicines the morning of surgery with A SIP OF WATER: Zyrtec   Stop all vitamins and herbal supplements a week before surgery.     Stop Motrin, Aleve, Ibuprofen a week before surgery.             You may not have any metal on your body including hair pins, jewelry, and body piercing             Do not wear make-up, lotions, powders, perfumes or deodorant  Do not wear nail polish including gel and S&S, artificial/acrylic nails, or any other type of covering on natural nails including finger and toenails. If you have artificial nails, gel coating, etc. that needs to be removed by a nail salon please have this removed prior to surgery or surgery may need to be canceled/ delayed if the surgeon/ anesthesia feels like they are unable to be safely monitored.   Do not shave  48 hours prior to surgery.    Contacts, dentures or bridgework may not be worn into surgery.  Bring small overnight bag day of surgery.  Do not bring valuables to the hospital. [facility] IS NOT RESPONSIBLE FOR VALUABLES.  Please read over the following fact sheets you were given: IF YOU HAVE QUESTIONS ABOUT YOUR PRE OP  INSTRUCTIONS PLEASE CALL [phone] [facility] - Preparing for Surgery Before surgery, you can play an important role.  Because skin is not sterile, your skin needs to be as free of germs as possible.  You can reduce the number of germs on your skin by washing with CHG (chlorahexidine gluconate) soap before surgery.  CHG is an antiseptic cleaner which kills germs and bonds with the skin to continue killing germs even after washing. Please DO NOT use if you have an allergy to CHG or antibacterial soaps.  If your skin becomes reddened/irritated stop using the CHG and inform your nurse when you arrive at Short Stay. Do not shave (including legs and underarms) for at least 48 hours prior to the first CHG shower.  You may shave your face/neck.  Please follow these instructions carefully:  1.  Shower with CHG Soap the night before surgery and the  morning of surgery.  2.  If you choose to wash your hair, wash your hair first as usual with your normal  shampoo.  3.  After you shampoo, rinse your hair and body thoroughly to remove the shampoo.                             4.  Use CHG as you would any other liquid soap.  You can apply chg directly to the skin and  wash.  Gently with a scrungie or clean washcloth.  5.  Apply the CHG Soap to your body ONLY FROM THE NECK DOWN.   Do   not use on face/ open                           Wound or open sores. Avoid contact with eyes, ears mouth and   genitals (private parts).                       Wash face,  Genitals (private parts) with your normal soap.             6.  Wash thoroughly, paying special attention to the area where your    surgery  will be performed.  7.  Thoroughly rinse your body with warm water from the neck down.  8.  DO NOT shower/wash with your normal soap after using and rinsing off the CHG Soap.                9.  Pat yourself dry with a clean towel.            10.  Wear clean pajamas.            11.  Place clean sheets on your bed  the night of your first shower and do not  sleep with pets. Day of Surgery : Do not apply any lotions/deodorants the morning of surgery.  Please wear clean clothes to the hospital/surgery center.  FAILURE TO FOLLOW THESE INSTRUCTIONS MAY RESULT IN THE CANCELLATION OF YOUR SURGERY  PATIENT SIGNATURE_________________________________  NURSE SIGNATURE__________________________________  ________________________________________________________________________   Rogelia MireIncentive Spirometer  An incentive spirometer is a tool that can help keep your lungs clear and active. This tool measures how well you are filling your lungs with each breath. Taking long deep breaths may help reverse or decrease the chance of developing breathing (pulmonary) problems (especially infection) following: A long period of time when you are unable to move or be active. BEFORE THE PROCEDURE  If the spirometer includes an indicator to show your best effort, your nurse or respiratory therapist will set it to a desired goal. If possible, sit up straight or lean slightly forward. Try not to slouch. Hold the incentive spirometer in an upright position. INSTRUCTIONS FOR USE  Sit on the edge of your bed if possible, or sit up as far as you can in bed or on a chair. Hold the incentive spirometer in an upright position. Breathe out normally. Place the mouthpiece in your mouth and seal your lips tightly around it. Breathe in slowly and as deeply as possible, raising the piston or the ball toward the top of the column. Hold your breath for 3-5 seconds or for as long as possible. Allow the piston or ball to fall to the bottom of the column. Remove the mouthpiece from your mouth and breathe out normally. Rest for a few seconds and repeat Steps 1 through 7 at least 10 times every 1-2 hours when you are awake. Take your time and take a few normal breaths between deep breaths. The spirometer may include an indicator to show your best  effort. Use the indicator as a goal to work toward during each repetition. After each set of 10 deep breaths, practice coughing to be sure your lungs are clear. If you have an incision (the cut made at the time of surgery), support your incision when  coughing by placing a pillow or rolled up towels firmly against it. Once you are able to get out of bed, walk around indoors and cough well. You may stop using the incentive spirometer when instructed by your caregiver.  RISKS AND COMPLICATIONS Take your time so you do not get dizzy or light-headed. If you are in pain, you may need to take or ask for pain medication before doing incentive spirometry. It is harder to take a deep breath if you are having pain. AFTER USE Rest and breathe slowly and easily. It can be helpful to keep track of a log of your progress. Your caregiver can provide you with a simple table to help with this. If you are using the spirometer at home, follow these instructions: SEEK MEDICAL CARE IF:  You are having difficultly using the spirometer. You have trouble using the spirometer as often as instructed. Your pain medication is not giving enough relief while using the spirometer. You develop fever of 100.5 F (38.1 C) or higher. SEEK IMMEDIATE MEDICAL CARE IF:  You cough up bloody sputum that had not been present before. You develop fever of 102 F (38.9 C) or greater. You develop worsening pain at or near the incision site. MAKE SURE YOU:  Understand these instructions. Will watch your condition. Will get help right away if you are not doing well or get worse. Document Released: 05/31/2006 Document Revised: 04/12/2011 Document Reviewed: 08/01/2006 Exit[employer] Patient Information 2014 ExitCare, [place].   ________________________________________________________________________  WHAT IS A BLOOD TRANSFUSION? Blood Transfusion Information  A transfusion is the replacement of blood or some of its parts. Blood is made up of  multiple cells which provide different functions. Red blood cells carry oxygen and are used for blood loss replacement. White blood cells fight against infection. Platelets control bleeding. Plasma helps clot blood. Other blood products are available for specialized needs, such as hemophilia or other clotting disorders. BEFORE THE TRANSFUSION  Who gives blood for transfusions?  Healthy volunteers who are fully evaluated to make sure their blood is safe. This is blood bank blood. Transfusion therapy is the safest it has ever been in the practice of medicine. Before blood is taken from a donor, a complete history is taken to make sure that person has no history of diseases nor engages in risky social behavior (examples are intravenous drug use or sexual activity with multiple partners). The donor's travel history is screened to minimize risk of transmitting infections, such as malaria. The donated blood is tested for signs of infectious diseases, such as HIV and hepatitis. The blood is then tested to be sure it is compatible with you in order to minimize the chance of a transfusion reaction. If you or a relative donates blood, this is often done in anticipation of surgery and is not appropriate for emergency situations. It takes many days to process the donated blood. RISKS AND COMPLICATIONS Although transfusion therapy is very safe and saves many lives, the main dangers of transfusion include:  Getting an infectious disease. Developing a transfusion reaction. This is an allergic reaction to something in the blood you were given. Every precaution is taken to prevent this. The decision to have a blood transfusion has been considered carefully by your caregiver before blood is given. Blood is not given unless the benefits outweigh the risks. AFTER THE TRANSFUSION Right after receiving a blood transfusion, you will usually feel much better and more energetic. This is especially true if your red blood  cells have gotten  low (anemic). The transfusion raises the level of the red blood cells which carry oxygen, and this usually causes an energy increase. The nurse administering the transfusion will monitor you carefully for complications. HOME CARE INSTRUCTIONS  No special instructions are needed after a transfusion. You may find your energy is better. Speak with your caregiver about any limitations on activity for underlying diseases you may have. SEEK MEDICAL CARE IF:  Your condition is not improving after your transfusion. You develop redness or irritation at the intravenous (IV) site. SEEK IMMEDIATE MEDICAL CARE IF:  Any of the following symptoms occur over the next 12 hours: Shaking chills. You have a temperature by mouth above 102 F (38.9 C), not controlled by medicine. Chest, back, or muscle pain. People around you feel you are not acting correctly or are confused. Shortness of breath or difficulty breathing. Dizziness and fainting. You get a rash or develop hives. You have a decrease in urine output. Your urine turns a dark color or changes to pink, red, or brown. Any of the following symptoms occur over the next 10 days: You have a temperature by mouth above 102 F (38.9 C), not controlled by medicine. Shortness of breath. Weakness after normal activity. The white part of the eye turns yellow (jaundice). You have a decrease in the amount of urine or are urinating less often. Your urine turns a dark color or changes to pink, red, or brown. Document Released: 01/16/2000 Document Revised: 04/12/2011 Document Reviewed: 09/04/2007 [facility] ParisExit[facility] Patient Information 2014 [place], [place].  _______________________________________________________________________

## 2021-03-19 ENCOUNTER — Other Ambulatory Visit: Payer: Self-pay

## 2021-03-19 ENCOUNTER — Encounter (HOSPITAL_COMMUNITY): Payer: Self-pay

## 2021-03-19 ENCOUNTER — Encounter (HOSPITAL_COMMUNITY)
Admission: RE | Admit: 2021-03-19 | Discharge: 2021-03-19 | Disposition: A | Discharge status: Home / Self Care | Payer: BC Managed Care – PPO | Source: Ambulatory Visit | Attending: Orthopaedic Surgery | Admitting: Orthopaedic Surgery

## 2021-03-19 VITALS — BP 128/68 | HR 86 | Temp 98.5°F | Resp 16 | Ht 65.75 in | Wt 231.2 lb

## 2021-03-19 DIAGNOSIS — Z01818 Encounter for other preprocedural examination: Secondary | ICD-10-CM | POA: Diagnosis not present

## 2021-03-19 DIAGNOSIS — M1711 Unilateral primary osteoarthritis, right knee: Secondary | ICD-10-CM | POA: Insufficient documentation

## 2021-03-19 DIAGNOSIS — I2510 Atherosclerotic heart disease of native coronary artery without angina pectoris: Secondary | ICD-10-CM | POA: Diagnosis not present

## 2021-03-19 HISTORY — DX: Essential (primary) hypertension: I10

## 2021-03-19 LAB — BASIC METABOLIC PANEL
Anion gap: 7 (ref 5–15)
BUN: 22 mg/dL — ABNORMAL HIGH (ref 6–20)
CO2: 29 mmol/L (ref 22–32)
Calcium: 10.2 mg/dL (ref 8.9–10.3)
Chloride: 103 mmol/L (ref 98–111)
Creatinine, Ser: 0.58 mg/dL (ref 0.44–1.00)
GFR, Estimated: >60 mL/min (ref >60)
Glucose, Bld: 103 mg/dL — ABNORMAL HIGH (ref 70–99)
Potassium: 4.1 mmol/L (ref 3.5–5.1)
Sodium: 139 mmol/L (ref 135–145)

## 2021-03-19 LAB — CBC
HCT: 44.1 % (ref 36.0–46.0)
Hemoglobin: 14.5 g/dL (ref 12.0–15.0)
MCH: 29.7 pg (ref 26.0–34.0)
MCHC: 32.9 g/dL (ref 30.0–36.0)
MCV: 90.4 fL (ref 80.0–100.0)
Platelets: 273 10*3/uL (ref 150–400)
RBC: 4.88 MIL/uL (ref 3.87–5.11)
RDW: 13.2 % (ref 11.5–15.5)
WBC: 7.4 10*3/uL (ref 4.0–10.5)
nRBC: 0 % (ref 0.0–0.2)

## 2021-03-19 LAB — SURGICAL PCR SCREEN
MRSA, PCR: NEGATIVE
Staphylococcus aureus: NEGATIVE

## 2021-03-26 ENCOUNTER — Telehealth: Payer: Self-pay | Admitting: Orthopaedic Surgery

## 2021-03-26 NOTE — H&P (Signed)
TOTAL KNEE ADMISSION H&P  Patient is being admitted for right total knee arthroplasty.  Subjective:  Chief Complaint:right knee pain.  HPI: Tracy Orozco, 61 y.o. female, has a history of pain and functional disability in the right knee due to arthritis and has failed non-surgical conservative treatments for greater than 12 weeks to includeNSAID's and/or analgesics, corticosteriod injections, flexibility and strengthening excercises, weight reduction as appropriate, and activity modification.  Onset of symptoms was gradual, starting 2 years ago with gradually worsening course since that time. The patient noted no past surgery on the right knee(s).  Patient currently rates pain in the right knee(s) at 10 out of 10 with activity. Patient has night pain, worsening of pain with activity and weight bearing, pain that interferes with activities of daily living, pain with passive range of motion, crepitus, and joint swelling.  Patient has evidence of subchondral sclerosis, periarticular osteophytes, and joint space narrowing by imaging studies. There is no active infection.  Patient Active Problem List   Diagnosis Date Noted   Unilateral primary osteoarthritis, right knee 10/29/2020   COVID-19 virus infection 06/09/2019   Acute respiratory failure with hypoxia ([place]) 06/09/2019   Past Medical History:  Diagnosis Date   Arthritis    Hypertension     Past Surgical History:  Procedure Laterality Date   ABDOMINAL HYSTERECTOMY     ANKLE SURGERY Left    BACK SURGERY     JOINT REPLACEMENT     Bilateral hip replacements.   SPINAL FUSION     STRABISMUS SURGERY Bilateral    TOTAL HIP ARTHROPLASTY Bilateral    WISDOM TOOTH EXTRACTION      No current facility-administered medications for this encounter.   Current Outpatient Medications  Medication Sig Dispense Refill Last Dose   ascorbic acid (VITAMIN C) 500 MG tablet Take 1 tablet (500 mg total) by mouth daily. 14 tablet 0    calcium gluconate 500  MG tablet Take 500 mg by mouth daily.       cetirizine (ZYRTEC) 10 MG tablet Take 10 mg by mouth daily.      Cholecalciferol (VITAMIN D) 50 MCG (2000 UT) tablet Take 4,000 Units by mouth daily.      diclofenac Sodium (VOLTAREN) 1 % GEL Apply 1 application topically daily as needed (pain).      ferrous sulfate 325 (65 FE) MG tablet Take 325 mg by mouth daily.      GLUCOSAMINE-CHONDROITIN PO Take 2 tablets by mouth daily. 1500/1200      ibuprofen (ADVIL) 200 MG tablet Take 800 mg by mouth 3 (three) times daily.      Magnesium 250 MG TABS Take 250 mg by mouth daily.      methocarbamol (ROBAXIN) 500 MG tablet Take 500 mg by mouth 2 (two) times daily as needed for muscle spasms.      Multiple Vitamin (MULTIVITAMIN WITH MINERALS) TABS tablet Take 1 tablet by mouth daily.      pseudoephedrine (SUDAFED) 30 MG tablet Take 30 mg by mouth every 4 (four) hours as needed for congestion.      valsartan (DIOVAN) 160 MG tablet Take 160 mg by mouth daily.      zinc sulfate 220 (50 Zn) MG capsule Take 1 capsule (220 mg total) by mouth daily. 14 capsule 0    Allergies  Allergen Reactions   Morphine And Related Palpitations and Other (See Comments)    "Heart pounds".    Social History   Tobacco Use   Smoking status: Never  Smokeless tobacco: Not on file  Substance Use Topics   Alcohol use: Never    Family History  Problem Relation Age of Onset   Hypertension Mother    CAD Mother    Hypertension Father    CAD Father    Diabetes Brother      Review of Systems  Musculoskeletal:  Positive for gait problem and joint swelling.  All other systems reviewed and are negative.  Objective:  Physical Exam Vitals reviewed.  Constitutional:      Appearance: Normal appearance.  HENT:     Head: Normocephalic and atraumatic.  Eyes:     Extraocular Movements: Extraocular movements intact.     Pupils: Pupils are equal, round, and reactive to light.  Cardiovascular:     Rate and Rhythm: Normal rate and  regular rhythm.  Pulmonary:     Effort: Pulmonary effort is normal.     Breath sounds: Normal breath sounds.  Abdominal:     Palpations: Abdomen is soft.  Musculoskeletal:     Cervical back: Normal range of motion and neck supple.     Right knee: Effusion, bony tenderness and crepitus present. Decreased range of motion. Tenderness present over the medial joint line, lateral joint line and patellar tendon. Abnormal alignment.  Neurological:     Mental Status: She is alert and oriented to person, place, and time.  Psychiatric:        Behavior: Behavior normal.    Vital signs in last 24 hours:    Labs:   Estimated body mass index is 37.6 kg/m as calculated from the following:   Height as of 03/19/21: 5' 5.75" (1.67 m).   Weight as of 03/19/21: 104.9 kg.   Imaging Review Plain radiographs demonstrate severe degenerative joint disease of the right knee(s). The overall alignment isneutral. The bone quality appears to be good for age and reported activity level.      Assessment/Plan:  End stage arthritis, right knee   The patient history, physical examination, clinical judgment of the provider and imaging studies are consistent with end stage degenerative joint disease of the right knee(s) and total knee arthroplasty is deemed medically necessary. The treatment options including medical management, injection therapy arthroscopy and arthroplasty were discussed at length. The risks and benefits of total knee arthroplasty were presented and reviewed. The risks due to aseptic loosening, infection, stiffness, patella tracking problems, thromboembolic complications and other imponderables were discussed. The patient acknowledged the explanation, agreed to proceed with the plan and consent was signed. Patient is being admitted for inpatient treatment for surgery, pain control, PT, OT, prophylactic antibiotics, VTE prophylaxis, progressive ambulation and ADL's and discharge planning. The patient  is planning to be discharged home with home health services

## 2021-03-26 NOTE — Telephone Encounter (Signed)
Pt called and is getting a crown. She would like to know what she is suppose to have or do since she is going to have surgery tomorrow. She can have the crown done on 03/22.   CB [phone]

## 2021-03-27 ENCOUNTER — Ambulatory Visit (HOSPITAL_COMMUNITY): Payer: BC Managed Care – PPO | Admitting: Anesthesiology

## 2021-03-27 ENCOUNTER — Encounter (HOSPITAL_COMMUNITY): Payer: Self-pay | Admitting: Orthopaedic Surgery

## 2021-03-27 ENCOUNTER — Encounter (HOSPITAL_COMMUNITY)
Admission: RE | Disposition: A | Discharge status: Home Health | Payer: Self-pay | Source: Home / Self Care | Attending: Orthopaedic Surgery

## 2021-03-27 ENCOUNTER — Other Ambulatory Visit: Payer: Self-pay

## 2021-03-27 ENCOUNTER — Observation Stay (HOSPITAL_COMMUNITY)
Admission: RE | Admit: 2021-03-27 | Discharge: 2021-03-29 | Disposition: A | Discharge status: Home Health | Payer: BC Managed Care – PPO | Attending: Orthopaedic Surgery | Admitting: Orthopaedic Surgery

## 2021-03-27 ENCOUNTER — Observation Stay (HOSPITAL_COMMUNITY): Payer: BC Managed Care – PPO

## 2021-03-27 DIAGNOSIS — Z96643 Presence of artificial hip joint, bilateral: Secondary | ICD-10-CM | POA: Insufficient documentation

## 2021-03-27 DIAGNOSIS — Z8616 Personal history of COVID-19: Secondary | ICD-10-CM | POA: Insufficient documentation

## 2021-03-27 DIAGNOSIS — I10 Essential (primary) hypertension: Secondary | ICD-10-CM | POA: Diagnosis not present

## 2021-03-27 DIAGNOSIS — G8918 Other acute postprocedural pain: Secondary | ICD-10-CM | POA: Diagnosis not present

## 2021-03-27 DIAGNOSIS — Z885 Allergy status to narcotic agent status: Secondary | ICD-10-CM | POA: Insufficient documentation

## 2021-03-27 DIAGNOSIS — Z96651 Presence of right artificial knee joint: Secondary | ICD-10-CM

## 2021-03-27 DIAGNOSIS — Z471 Aftercare following joint replacement surgery: Secondary | ICD-10-CM | POA: Diagnosis not present

## 2021-03-27 DIAGNOSIS — Z20822 Contact with and (suspected) exposure to covid-19: Secondary | ICD-10-CM | POA: Diagnosis not present

## 2021-03-27 DIAGNOSIS — Z01818 Encounter for other preprocedural examination: Secondary | ICD-10-CM

## 2021-03-27 DIAGNOSIS — M1711 Unilateral primary osteoarthritis, right knee: Principal | ICD-10-CM

## 2021-03-27 HISTORY — PX: TOTAL KNEE ARTHROPLASTY: SHX125

## 2021-03-27 LAB — TYPE AND SCREEN
ABO/RH(D): O POS
Antibody Screen: NEGATIVE

## 2021-03-27 LAB — SARS CORONAVIRUS 2 BY RT PCR (HOSPITAL ORDER, PERFORMED IN ~~LOC~~ HOSPITAL LAB): SARS Coronavirus 2: NEGATIVE

## 2021-03-27 SURGERY — ARTHROPLASTY, KNEE, TOTAL
Anesthesia: Spinal | Site: Knee | Laterality: Right

## 2021-03-27 MED ORDER — 0.9 % SODIUM CHLORIDE (POUR BTL) OPTIME
TOPICAL | Status: DC | PRN
  Administered 2021-03-27: 1000 mL

## 2021-03-27 MED ORDER — OXYCODONE HCL 5 MG PO TABS: 5.0000 mg | ORAL_TABLET | Freq: Once | ORAL | Status: DC | PRN

## 2021-03-27 MED ORDER — GLYCOPYRROLATE 0.2 MG/ML IJ SOLN
INTRAMUSCULAR | Status: DC | PRN
  Administered 2021-03-27: .2 mg via INTRAVENOUS

## 2021-03-27 MED ORDER — SODIUM CHLORIDE 0.9 % IV SOLN: INTRAVENOUS | Status: DC

## 2021-03-27 MED ORDER — METHOCARBAMOL 500 MG PO TABS
500.0000 mg | ORAL_TABLET | Freq: Four times a day (QID) | ORAL | Status: DC | PRN
  Administered 2021-03-27 – 2021-03-28 (×2): 500 mg via ORAL
  Filled 2021-03-27 (×2): qty 1

## 2021-03-27 MED ORDER — PHENYLEPHRINE HCL (PRESSORS) 10 MG/ML IV SOLN
INTRAVENOUS | Status: AC
  Filled 2021-03-27: qty 1

## 2021-03-27 MED ORDER — TRANEXAMIC ACID-NACL 1000-0.7 MG/100ML-% IV SOLN
1000.0000 mg | INTRAVENOUS | Status: AC
  Administered 2021-03-27: 1000 mg via INTRAVENOUS
  Filled 2021-03-27: qty 100

## 2021-03-27 MED ORDER — PHENYLEPHRINE HCL-NACL 20-0.9 MG/250ML-% IV SOLN
INTRAVENOUS | Status: DC | PRN
  Administered 2021-03-27: 40 ug/min via INTRAVENOUS

## 2021-03-27 MED ORDER — PHENYLEPHRINE 40 MCG/ML (10ML) SYRINGE FOR IV PUSH (FOR BLOOD PRESSURE SUPPORT)
PREFILLED_SYRINGE | INTRAVENOUS | Status: AC
  Filled 2021-03-27: qty 10

## 2021-03-27 MED ORDER — VITAMIN D 25 MCG (1000 UNIT) PO TABS
4000.0000 [IU] | ORAL_TABLET | Freq: Every day | ORAL | Status: DC
  Administered 2021-03-27 – 2021-03-29 (×3): 4000 [IU] via ORAL
  Filled 2021-03-27 (×3): qty 4

## 2021-03-27 MED ORDER — CHLORHEXIDINE GLUCONATE 0.12 % MT SOLN
15.0000 mL | Freq: Once | OROMUCOSAL | Status: AC
  Administered 2021-03-27: 15 mL via OROMUCOSAL

## 2021-03-27 MED ORDER — ALUM & MAG HYDROXIDE-SIMETH 200-200-20 MG/5ML PO SUSP: 30.0000 mL | ORAL | Status: DC | PRN

## 2021-03-27 MED ORDER — ADULT MULTIVITAMIN W/MINERALS CH
1.0000 | ORAL_TABLET | Freq: Every day | ORAL | Status: DC
  Administered 2021-03-27 – 2021-03-29 (×3): 1 via ORAL
  Filled 2021-03-27 (×3): qty 1

## 2021-03-27 MED ORDER — IRBESARTAN 150 MG PO TABS
150.0000 mg | ORAL_TABLET | Freq: Every day | ORAL | Status: DC
  Administered 2021-03-27 – 2021-03-29 (×3): 150 mg via ORAL
  Filled 2021-03-27 (×3): qty 1

## 2021-03-27 MED ORDER — OXYCODONE HCL 5 MG/5ML PO SOLN: 5.0000 mg | Freq: Once | ORAL | Status: DC | PRN

## 2021-03-27 MED ORDER — AMISULPRIDE (ANTIEMETIC) 5 MG/2ML IV SOLN: 10.0000 mg | Freq: Once | INTRAVENOUS | Status: DC | PRN

## 2021-03-27 MED ORDER — DEXAMETHASONE SODIUM PHOSPHATE 10 MG/ML IJ SOLN
INTRAMUSCULAR | Status: AC
  Filled 2021-03-27: qty 1

## 2021-03-27 MED ORDER — MIDAZOLAM HCL 2 MG/2ML IJ SOLN
1.0000 mg | INTRAMUSCULAR | Status: DC
  Administered 2021-03-27: 1 mg via INTRAVENOUS
  Filled 2021-03-27: qty 2

## 2021-03-27 MED ORDER — LACTATED RINGERS IV SOLN: INTRAVENOUS | Status: DC

## 2021-03-27 MED ORDER — MENTHOL 3 MG MT LOZG: 1.0000 | LOZENGE | OROMUCOSAL | Status: DC | PRN

## 2021-03-27 MED ORDER — ZINC SULFATE 220 (50 ZN) MG PO CAPS
220.0000 mg | ORAL_CAPSULE | Freq: Every day | ORAL | Status: DC
  Administered 2021-03-27 – 2021-03-29 (×3): 220 mg via ORAL
  Filled 2021-03-27 (×3): qty 1

## 2021-03-27 MED ORDER — METOCLOPRAMIDE HCL 5 MG PO TABS: 5.0000 mg | ORAL_TABLET | Freq: Three times a day (TID) | ORAL | Status: DC | PRN

## 2021-03-27 MED ORDER — PROPOFOL 10 MG/ML IV BOLUS
INTRAVENOUS | Status: DC | PRN
  Administered 2021-03-27: 30 mg via INTRAVENOUS

## 2021-03-27 MED ORDER — CEFAZOLIN SODIUM-DEXTROSE 1-4 GM/50ML-% IV SOLN
1.0000 g | Freq: Four times a day (QID) | INTRAVENOUS | Status: AC
  Administered 2021-03-27 – 2021-03-28 (×2): 1 g via INTRAVENOUS
  Filled 2021-03-27 (×2): qty 50

## 2021-03-27 MED ORDER — PROPOFOL 500 MG/50ML IV EMUL
INTRAVENOUS | Status: DC | PRN
  Administered 2021-03-27: 100 ug/kg/min via INTRAVENOUS

## 2021-03-27 MED ORDER — PHENOL 1.4 % MT LIQD: 1.0000 | OROMUCOSAL | Status: DC | PRN

## 2021-03-27 MED ORDER — ORAL CARE MOUTH RINSE: 15.0000 mL | Freq: Once | OROMUCOSAL | Status: AC

## 2021-03-27 MED ORDER — FENTANYL CITRATE PF 50 MCG/ML IJ SOSY
50.0000 ug | PREFILLED_SYRINGE | INTRAMUSCULAR | Status: DC
  Administered 2021-03-27: 50 ug via INTRAVENOUS
  Filled 2021-03-27: qty 2

## 2021-03-27 MED ORDER — POVIDONE-IODINE 10 % EX SWAB
2.0000 "application " | Freq: Once | CUTANEOUS | Status: AC
  Administered 2021-03-27: 2 via TOPICAL

## 2021-03-27 MED ORDER — ONDANSETRON HCL 4 MG/2ML IJ SOLN: 4.0000 mg | Freq: Once | INTRAMUSCULAR | Status: DC | PRN

## 2021-03-27 MED ORDER — BUPIVACAINE IN DEXTROSE 0.75-8.25 % IT SOLN
INTRATHECAL | Status: DC | PRN
  Administered 2021-03-27: 1.6 mL via INTRATHECAL

## 2021-03-27 MED ORDER — ACETAMINOPHEN 325 MG PO TABS: 325.0000 mg | ORAL_TABLET | Freq: Four times a day (QID) | ORAL | Status: DC | PRN

## 2021-03-27 MED ORDER — SODIUM CHLORIDE 0.9 % IR SOLN
Status: DC | PRN
  Administered 2021-03-27: 1000 mL

## 2021-03-27 MED ORDER — CALCIUM GLUCONATE 500 MG PO TABS
500.0000 mg | ORAL_TABLET | Freq: Every day | ORAL | Status: DC
  Filled 2021-03-27: qty 1

## 2021-03-27 MED ORDER — ASCORBIC ACID 500 MG PO TABS
500.0000 mg | ORAL_TABLET | Freq: Every day | ORAL | Status: DC
  Administered 2021-03-27 – 2021-03-29 (×3): 500 mg via ORAL
  Filled 2021-03-27 (×3): qty 1

## 2021-03-27 MED ORDER — METHOCARBAMOL 500 MG IVPB - SIMPLE MED
500.0000 mg | Freq: Four times a day (QID) | INTRAVENOUS | Status: DC | PRN
  Filled 2021-03-27: qty 50

## 2021-03-27 MED ORDER — DEXAMETHASONE SODIUM PHOSPHATE 10 MG/ML IJ SOLN
INTRAMUSCULAR | Status: DC | PRN
  Administered 2021-03-27: 10 mg via INTRAVENOUS

## 2021-03-27 MED ORDER — ONDANSETRON HCL 4 MG/2ML IJ SOLN: 4.0000 mg | Freq: Four times a day (QID) | INTRAMUSCULAR | Status: DC | PRN

## 2021-03-27 MED ORDER — PROPOFOL 1000 MG/100ML IV EMUL
INTRAVENOUS | Status: AC
  Filled 2021-03-27: qty 100

## 2021-03-27 MED ORDER — ACETAMINOPHEN 500 MG PO TABS
1000.0000 mg | ORAL_TABLET | Freq: Once | ORAL | Status: AC
  Administered 2021-03-27: 1000 mg via ORAL
  Filled 2021-03-27: qty 2

## 2021-03-27 MED ORDER — ROPIVACAINE HCL 5 MG/ML IJ SOLN
INTRAMUSCULAR | Status: DC | PRN
Start: 2021-03-27 — End: 2021-03-27
  Administered 2021-03-27: 30 mL via PERINEURAL

## 2021-03-27 MED ORDER — HYDROMORPHONE HCL 1 MG/ML IJ SOLN
0.5000 mg | INTRAMUSCULAR | Status: DC | PRN
  Administered 2021-03-27: 1 mg via INTRAVENOUS
  Filled 2021-03-27: qty 1

## 2021-03-27 MED ORDER — ASPIRIN 81 MG PO CHEW
81.0000 mg | CHEWABLE_TABLET | Freq: Two times a day (BID) | ORAL | Status: DC
  Administered 2021-03-27 – 2021-03-29 (×4): 81 mg via ORAL
  Filled 2021-03-27 (×4): qty 1

## 2021-03-27 MED ORDER — FENTANYL CITRATE PF 50 MCG/ML IJ SOSY: 25.0000 ug | PREFILLED_SYRINGE | INTRAMUSCULAR | Status: DC | PRN

## 2021-03-27 MED ORDER — PROPOFOL 500 MG/50ML IV EMUL
INTRAVENOUS | Status: AC
  Filled 2021-03-27: qty 50

## 2021-03-27 MED ORDER — MAGNESIUM OXIDE -MG SUPPLEMENT 400 (240 MG) MG PO TABS
200.0000 mg | ORAL_TABLET | Freq: Every day | ORAL | Status: DC
  Administered 2021-03-27 – 2021-03-29 (×3): 200 mg via ORAL
  Filled 2021-03-27 (×3): qty 1

## 2021-03-27 MED ORDER — CEFAZOLIN SODIUM-DEXTROSE 2-4 GM/100ML-% IV SOLN
2.0000 g | INTRAVENOUS | Status: AC
  Administered 2021-03-27: 2 g via INTRAVENOUS
  Filled 2021-03-27: qty 100

## 2021-03-27 MED ORDER — PANTOPRAZOLE SODIUM 40 MG PO TBEC
40.0000 mg | DELAYED_RELEASE_TABLET | Freq: Every day | ORAL | Status: DC
  Administered 2021-03-27 – 2021-03-29 (×3): 40 mg via ORAL
  Filled 2021-03-27 (×3): qty 1

## 2021-03-27 MED ORDER — DIPHENHYDRAMINE HCL 12.5 MG/5ML PO ELIX: 12.5000 mg | ORAL_SOLUTION | ORAL | Status: DC | PRN

## 2021-03-27 MED ORDER — ONDANSETRON HCL 4 MG PO TABS: 4.0000 mg | ORAL_TABLET | Freq: Four times a day (QID) | ORAL | Status: DC | PRN

## 2021-03-27 MED ORDER — FERROUS SULFATE 325 (65 FE) MG PO TABS
325.0000 mg | ORAL_TABLET | Freq: Every day | ORAL | Status: DC
  Administered 2021-03-28 – 2021-03-29 (×2): 325 mg via ORAL
  Filled 2021-03-27 (×2): qty 1

## 2021-03-27 MED ORDER — METOCLOPRAMIDE HCL 5 MG/ML IJ SOLN: 5.0000 mg | Freq: Three times a day (TID) | INTRAMUSCULAR | Status: DC | PRN

## 2021-03-27 MED ORDER — OXYCODONE HCL 5 MG PO TABS
5.0000 mg | ORAL_TABLET | ORAL | Status: DC | PRN
  Administered 2021-03-27 – 2021-03-29 (×4): 10 mg via ORAL
  Filled 2021-03-27 (×4): qty 2

## 2021-03-27 MED ORDER — DOCUSATE SODIUM 100 MG PO CAPS
100.0000 mg | ORAL_CAPSULE | Freq: Two times a day (BID) | ORAL | Status: DC
  Administered 2021-03-27 – 2021-03-29 (×4): 100 mg via ORAL
  Filled 2021-03-27 (×4): qty 1

## 2021-03-27 MED ORDER — OXYCODONE HCL 5 MG PO TABS
10.0000 mg | ORAL_TABLET | ORAL | Status: DC | PRN
  Administered 2021-03-28 – 2021-03-29 (×7): 15 mg via ORAL
  Administered 2021-03-29: 10 mg via ORAL
  Filled 2021-03-27 (×3): qty 3
  Filled 2021-03-27: qty 2
  Filled 2021-03-27 (×4): qty 3

## 2021-03-27 SURGICAL SUPPLY — 58 items
BAG COUNTER SPONGE SURGICOUNT (BAG) IMPLANT
BAG ZIPLOCK 12X15 (MISCELLANEOUS) ×2 IMPLANT
BENZOIN TINCTURE PRP APPL 2/3 (GAUZE/BANDAGES/DRESSINGS) IMPLANT
BLADE SAG 18X100X1.27 (BLADE) ×2 IMPLANT
BLADE SURG SZ10 CARB STEEL (BLADE) ×4 IMPLANT
BNDG ELASTIC 6X5.8 VLCR STR LF (GAUZE/BANDAGES/DRESSINGS) ×4 IMPLANT
BOWL SMART MIX CTS (DISPOSABLE) ×1 IMPLANT
CEMENT BONE R 1X40 (Cement) ×2 IMPLANT
COMP FEM CMT PERSONA SZ7 RT (Joint) ×2 IMPLANT
COMPONENT FEM CMT PRSONA SZ7RT (Joint) IMPLANT
COOLER ICEMAN CLASSIC (MISCELLANEOUS) ×2 IMPLANT
COVER SURGICAL LIGHT HANDLE (MISCELLANEOUS) ×2 IMPLANT
CUFF TOURN SGL QUICK 34 (TOURNIQUET CUFF) ×1
CUFF TRNQT CYL 34X4.125X (TOURNIQUET CUFF) ×1 IMPLANT
DRAPE INCISE IOBAN 66X45 STRL (DRAPES) ×2 IMPLANT
DRAPE U-SHAPE 47X51 STRL (DRAPES) ×2 IMPLANT
DRSG PAD ABDOMINAL 8X10 ST (GAUZE/BANDAGES/DRESSINGS) ×4 IMPLANT
DURAPREP 26ML APPLICATOR (WOUND CARE) ×2 IMPLANT
ELECT BLADE TIP CTD 4 INCH (ELECTRODE) ×2 IMPLANT
ELECT REM PT RETURN 15FT ADLT (MISCELLANEOUS) ×2 IMPLANT
GAUZE SPONGE 4X4 12PLY STRL (GAUZE/BANDAGES/DRESSINGS) ×2 IMPLANT
GAUZE XEROFORM 1X8 LF (GAUZE/BANDAGES/DRESSINGS) IMPLANT
GLOVE SRG 8 PF TXTR STRL LF DI (GLOVE) ×2 IMPLANT
GLOVE SURG ENC MOIS LTX SZ7.5 (GLOVE) ×2 IMPLANT
GLOVE SURG NEOPR MICRO LF SZ8 (GLOVE) ×2 IMPLANT
GLOVE SURG UNDER POLY LF SZ8 (GLOVE) ×2
GOWN STRL REUS W/TWL XL LVL3 (GOWN DISPOSABLE) ×4 IMPLANT
HANDPIECE INTERPULSE COAX TIP (DISPOSABLE) ×1
HDLS TROCR DRIL PIN KNEE 75 (PIN) ×4
HOLDER FOLEY CATH W/STRAP (MISCELLANEOUS) IMPLANT
IMMOBILIZER KNEE 20 (SOFTGOODS) ×2
IMMOBILIZER KNEE 20 THIGH 36 (SOFTGOODS) ×1 IMPLANT
INSERT TIB ASF SZ 6-7/EF 10 RT (Insert) ×1 IMPLANT
KIT TURNOVER KIT A (KITS) IMPLANT
NS IRRIG 1000ML POUR BTL (IV SOLUTION) ×2 IMPLANT
PACK TOTAL KNEE CUSTOM (KITS) ×2 IMPLANT
PAD COLD SHLDR WRAP-ON (PAD) ×2 IMPLANT
PADDING CAST COTTON 6X4 STRL (CAST SUPPLIES) ×4 IMPLANT
PIN DRILL HDLS TROCAR 75 4PK (PIN) IMPLANT
PROTECTOR NERVE ULNAR (MISCELLANEOUS) ×2 IMPLANT
SCREW FEMALE HEX FIX 25X2.5 (ORTHOPEDIC DISPOSABLE SUPPLIES) ×1 IMPLANT
SET HNDPC FAN SPRY TIP SCT (DISPOSABLE) ×1 IMPLANT
SET PAD KNEE POSITIONER (MISCELLANEOUS) ×2 IMPLANT
SPIKE FLUID TRANSFER (MISCELLANEOUS) IMPLANT
SPONGE T-LAP 18X18 ~~LOC~~+RFID (SPONGE) ×5 IMPLANT
STAPLER VISISTAT 35W (STAPLE) IMPLANT
STEM POLY PAT PLY 32M KNEE (Knees) ×1 IMPLANT
STEM TIBIA 5 DEG SZ E R KNEE (Knees) IMPLANT
STRIP CLOSURE SKIN 1/2X4 (GAUZE/BANDAGES/DRESSINGS) IMPLANT
SUT MNCRL AB 4-0 PS2 18 (SUTURE) IMPLANT
SUT VIC AB 0 CT1 27 (SUTURE) ×1
SUT VIC AB 0 CT1 27XBRD ANTBC (SUTURE) ×1 IMPLANT
SUT VIC AB 1 CT1 36 (SUTURE) ×4 IMPLANT
SUT VIC AB 2-0 CT1 27 (SUTURE) ×2
SUT VIC AB 2-0 CT1 TAPERPNT 27 (SUTURE) ×2 IMPLANT
TIBIA STEM 5 DEG SZ E R KNEE (Knees) ×2 IMPLANT
TRAY FOLEY MTR SLVR 16FR STAT (SET/KITS/TRAYS/PACK) IMPLANT
WATER STERILE IRR 1000ML POUR (IV SOLUTION) ×4 IMPLANT

## 2021-03-27 NOTE — Anesthesia Procedure Notes (Signed)
Spinal ° °Patient location during procedure: OR °Reason for block: surgical anesthesia °Staffing °Performed: anesthesiologist  °Anesthesiologist: Mitchel Delduca E, MD °Preanesthetic Checklist °Completed: patient identified, IV checked, risks and benefits discussed, surgical consent, monitors and equipment checked, pre-op evaluation and timeout performed °Spinal Block °Patient position: sitting °Prep: DuraPrep and site prepped and draped °Patient monitoring: continuous pulse ox, blood pressure and heart rate °Approach: midline °Location: L3-4 °Injection technique: single-shot °Needle °Needle type: Pencan  °Needle gauge: 24 G °Needle length: 10 cm °Assessment °Events: CSF return °Additional Notes °Functioning IV was confirmed and monitors were applied. Sterile prep and drape, including hand hygiene and sterile gloves were used. The patient was positioned and the spine was prepped. The skin was anesthetized with lidocaine.  Free flow of clear CSF was obtained prior to injecting local anesthetic into the CSF. The needle was carefully withdrawn. The patient tolerated the procedure well.  ° ° ° °

## 2021-03-27 NOTE — Interval H&P Note (Signed)
History and Physical Interval Note: The patient understands that she is here today for a right knee replacement to treat her severe arthritis of her right knee.  There has been no acute or interval change in her medical status.  Please see H&P.  The risks and benefits of surgery been explained in detail and informed consent is obtained.  The right operative knee has been marked.  03/27/2021 8:42 AM  Tracy Orozco  has presented today for surgery, with the diagnosis of Osteoarthritis / Degenerative joint diease Right Knee.  The various methods of treatment have been discussed with the patient and family. After consideration of risks, benefits and other options for treatment, the patient has consented to  Procedure(s): Right TOTAL KNEE ARTHROPLASTY (Right) as a surgical intervention.  The patient's history has been reviewed, patient examined, no change in status, stable for surgery.  I have reviewed the patient's chart and labs.  Questions were answered to the patient's satisfaction.     Mcarthur Rossetti

## 2021-03-27 NOTE — Anesthesia Postprocedure Evaluation (Signed)
Anesthesia Post Note  Patient: Tracy Orozco  Procedure(s) Performed: Right TOTAL KNEE ARTHROPLASTY (Right: Knee)     Patient location during evaluation: PACU Anesthesia Type: Spinal Level of consciousness: oriented and awake and alert Pain management: pain level controlled Vital Signs Assessment: post-procedure vital signs reviewed and stable Respiratory status: spontaneous breathing, respiratory function stable and nonlabored ventilation Cardiovascular status: blood pressure returned to baseline and stable Postop Assessment: no headache, no backache, no apparent nausea or vomiting and spinal receding Anesthetic complications: no   No notable events documented.  Last Vitals:  Vitals:   03/27/21 1500 03/27/21 1517  BP: 118/73 114/80  Pulse: 62 68  Resp: 12 16  Temp:    SpO2: 100% 100%    Last Pain:  Vitals:   03/27/21 1500  TempSrc:   PainSc: 0-No pain                 Lidia Collum

## 2021-03-27 NOTE — Progress Notes (Signed)
AssistedDr. Carolyn Witman with right, ultrasound guided, adductor canal block. Side rails up, monitors on throughout procedure. See vital signs in flow sheet. Tolerated Procedure well.  

## 2021-03-27 NOTE — Evaluation (Signed)
Physical Therapy Evaluation Patient Details Name: Tracy Orozco MRN: [id number] DOB: [birth year] Today's Date: 03/27/2021  History of Present Illness  Pt s/p R TKR and with hx of bil THR and spinal fusion L5 - S1  Clinical Impression  Pt s/p R TKR and presents with decreased R LE strength/ROM and post op pain limiting functional mobility.  Pt should progress well to dc home with family assist.     Recommendations for follow up therapy are one component of a multi-disciplinary discharge planning process, led by the attending physician.  Recommendations may be updated based on patient status, additional functional criteria and insurance authorization.  Follow Up Recommendations Follow physician's recommendations for discharge plan and follow up therapies    Assistance Recommended at Discharge Frequent or constant Supervision/Assistance  Patient can return home with the following  A little help with walking and/or transfers;A little help with bathing/dressing/bathroom;Assistance with cooking/housework;Assist for transportation;Help with stairs or ramp for entrance    Equipment Recommendations None recommended by PT  Recommendations for Other Services       Functional Status Assessment Patient has had a recent decline in their functional status and demonstrates the ability to make significant improvements in function in a reasonable and predictable amount of time.     Precautions / Restrictions Precautions Precautions: Fall;Knee Required Braces or Orthoses: Knee Immobilizer - Right Knee Immobilizer - Right: Discontinue once straight leg raise with < 10 degree lag Restrictions Weight Bearing Restrictions: No Other Position/Activity Restrictions: WBAT      Mobility  Bed Mobility Overal bed mobility: Needs Assistance Bed Mobility: Supine to Sit     Supine to sit: Min assist     General bed mobility comments: Increased time with use of bedrails and assist to manage R LE     Transfers Overall transfer level: Needs assistance Equipment used: Rolling walker (2 wheels) Transfers: Sit to/from Stand Sit to Stand: Min assist, Mod assist, From elevated surface           General transfer comment: cues for LE management and use of UEs to self assist    Ambulation/Gait Ambulation/Gait assistance: Min assist Gait Distance (Feet): 10 Feet Assistive device: Rolling walker (2 wheels) Gait Pattern/deviations: Step-to pattern, Decreased step length - right, Decreased step length - left, Shuffle, Trunk flexed Gait velocity: decr     General Gait Details: cues for sequence, posture and position from RW; distance ltd by balance deficits related to residual numbness in feet  Stairs            Wheelchair Mobility    Modified Rankin (Stroke Patients Only)       Balance Overall balance assessment: Needs assistance Sitting-balance support: No upper extremity supported, Feet supported Sitting balance-Leahy Scale: Good     Standing balance support: Bilateral upper extremity supported Standing balance-Leahy Scale: Poor                               Pertinent Vitals/Pain Pain Assessment Pain Assessment: 0-10 Pain Score: 3  Pain Location: R knee Pain Descriptors / Indicators: Aching, Sore Pain Intervention(s): Limited activity within patient's tolerance, Monitored during session, Premedicated before session, Ice applied    Home Living Family/patient expects to be discharged to:: Private residence Living Arrangements: Alone Available Help at Discharge: Family;Available 24 hours/day Type of Home: House Home Access: Stairs to enter Entrance Stairs-Rails: None Entrance Stairs-Number of Steps: 2   Home Layout: One level Home Equipment: Conservation officer, nature (  2 wheels) Additional Comments: Pt with be dc to sister's house - information above pertains to sister's house    Prior Function Prior Level of Function : Independent/Modified Independent                      Hand Dominance        Extremity/Trunk Assessment   Upper Extremity Assessment Upper Extremity Assessment: Overall WFL for tasks assessed    Lower Extremity Assessment Lower Extremity Assessment: RLE deficits/detail    Cervical / Trunk Assessment Cervical / Trunk Assessment: Normal  Communication   Communication: No difficulties  Cognition Arousal/Alertness: Awake/alert   Overall Cognitive Status: Within Functional Limits for tasks assessed                                          General Comments      Exercises Total Joint Exercises Ankle Circles/Pumps: AROM, Both, 15 reps, Supine   Assessment/Plan    PT Assessment Patient needs continued PT services  PT Problem List Decreased strength;Decreased range of motion;Decreased activity tolerance;Decreased balance;Decreased mobility;Decreased knowledge of use of DME;Pain       PT Treatment Interventions DME instruction;Gait training;Stair training;Functional mobility training;Therapeutic exercise;Therapeutic activities;Patient/family education    PT Goals (Current goals can be found in the Care Plan section)  Acute Rehab PT Goals Patient Stated Goal: Regain IND PT Goal Formulation: With patient Time For Goal Achievement: 04/03/21 Potential to Achieve Goals: Good    Frequency 7X/week     Co-evaluation               AM-PAC PT "6 Clicks" Mobility  Outcome Measure Help needed turning from your back to your side while in a flat bed without using bedrails?: A Little Help needed moving from lying on your back to sitting on the side of a flat bed without using bedrails?: A Little Help needed moving to and from a bed to a chair (including a wheelchair)?: A Little Help needed standing up from a chair using your arms (e.g., wheelchair or bedside chair)?: A Little Help needed to walk in hospital room?: Total Help needed climbing 3-5 steps with a railing? : Total 6 Click  Score: 14    End of Session Equipment Utilized During Treatment: Gait belt;Right knee immobilizer Activity Tolerance: Patient tolerated treatment well Patient left: in chair;with call bell/phone within reach;with chair alarm set;with family/visitor present Nurse Communication: Mobility status PT Visit Diagnosis: Unsteadiness on feet (R26.81);Difficulty in walking, not elsewhere classified (R26.2);Pain Pain - Right/Left: Right Pain - part of body: Knee    Time: 1800-1840 PT Time Calculation (min) (ACUTE ONLY): 40 min   Charges:   PT Evaluation $PT Eval Low Complexity: 1 Low PT Treatments $Gait Training: 8-22 mins        Debe Coder PT Acute Rehabilitation Services Pager [phone] Office [phone]   [facility] 03/27/2021, 6:54 PM

## 2021-03-27 NOTE — Op Note (Addendum)
NAMELATASCHA, LAKEY MEDICAL RECORD NO: [id number] ACCOUNT NO: [id number] DATE OF BIRTH: 1961-01-28 FACILITY: [facility]ATION: [facility] PHYSICIAN: Lind Guest. Ninfa Linden, MD  Operative Report   DATE OF PROCEDURE: 03/27/2021  PREOPERATIVE DIAGNOSIS:  Primary osteoarthritis and degenerative joint disease, right knee.  POSTOPERATIVE DIAGNOSIS:  Primary osteoarthritis and degenerative joint disease, right knee.  PROCEDURE:  Right total knee arthroplasty.  IMPLANTS:  Biomet/Zimmer Persona cemented knee system with size 7 CR right femur, size E right tibial tray, 10 mm medial congruent fixed bearing polyethylene insert, size 32 patellar button.  SURGEON:  Lind Guest. Ninfa Linden, MD  ASSISTANT:  Erskine Emery, PA-C  ANESTHESIA:   1.  Right lower extremity adductor canal block. 2.  Spinal.  ANTIBIOTICS:  2 g IV Ancef.  TOURNIQUET TIME: Just under one hour.  BLOOD LOSS:  123XX123 mL  COMPLICATIONS:  None.  INDICATIONS:  The patient is a 61 year old female well known to me.  She has debilitating arthritis involving her right knee, well documented with clinical exam, x-rays and even MRI.  Her right knee pain is daily.  There are areas of full-thickness  cartilage loss in all 3 compartments of her knee.  At this point, she has tried and failed all forms of conservative treatment and does wish proceed with a total knee arthroplasty.  We did talk in detail of the risk of acute blood loss anemia, nerve or  vessel injury, fracture, infection, DVT, implant failure, and skin and soft tissue issues.  We talked about our goals are being decreased pain, improve mobility and overall improve quality of life.  DESCRIPTION OF PROCEDURE:  After informed consent was obtained, appropriate right knee was marked and adductor canal block was obtained to the right lower extremity in the holding room.  She was then brought to the operating room and sat up on the  operating table where spinal anesthesia was obtained.   She was then laid in supine position on the operating table.  A Foley catheter was placed and a nonsterile tourniquet was placed around her upper right thigh.  Her right thigh, knee, leg, ankle and  foot were prepped and draped with DuraPrep and sterile drapes including a sterile stockinette.  A timeout was called.  She was identified as correct patient, correct right knee.  We then used Esmarch to wrap around the leg and tourniquet was inflated to  300 mm of pressure.  We then made a direct midline incision over the patella and carried this proximally and distally.  We dissected down the knee joint, carried out a medial parapatellar arthrotomy, finding a very large joint effusion.  With the knee in  a flexed position, we removed osteophytes from all three compartments as well as remnants of the ACL, medial and lateral meniscus.  Using extramedullary cutting guide, we started with our proximal tibia cut, correcting our varus and valgus and taking 10  mm off the high side.  This was also 3-degree slope.  We made this cut without difficulty.  We went to the femur and placed our femoral sizing guide based off the epicondylar axis and Whiteside's line.  Based off this, we chose a size 7 femur.  We put a  4-in-1 cutting block for a 7 femur, made our anterior and posterior cuts followed by our chamfer cuts.  Then we made our lug drill holes based off our femoral component.  Attention was then turned back to the tibia.  We chose a size E right tibial tray  for coverage  on the tibia. We set the rotation off of the tibial tubercle and the femur.  We made our keel punch and drill hole off of this.  With the size E right tibial tray, we trialed a size 7 right femur and a 10 mm medial congruent fixed bearing  polyethylene insert. We were pleased with range of motion and stability with this insert.  We then made our patellar cut and drilled three holes for size 32 patellar button. With all trial instrumentation in the  knee, I put her through several cycles of  motion and we were pleased with range of motion and stability.  We then removed all trial instrumentation from the knee.  We irrigated the knee with normal saline solution using pulsatile lavage.  We then mixed our cement. With the knee in a flexed  position,  cemented our Biomet Zimmer tibial tray size E for right knee followed by our size 7 right CR femur.  We removed excess cement debris from the knee and then placed our 10 mm medial congruent fixed bearing polyethylene insert.  We then cemented  our size 32 patellar button.  We held the knee compressed and fully extended to allow the cement to harden.  Once it hardened, I put her through several cycles of motion.  Again, we were pleased with range of motion and stability.  We then let the  tourniquet down.  Hemostasis was obtained with electrocautery.  The arthrotomy was closed with interrupted #1 Vicryl suture followed by 0 Vicryl for the deep tissue and 2-0 Vicryl to close the subcutaneous tissue.  The skin was closed with staples.  A  well-padded sterile dressing was applied.  She was taken to recovery room in stable condition with all final counts being correct.  No complications noted.  Of note, Erskine Emery, PA-C did assist in entire case and assistance was crucial for  facilitating all aspects of this case.  He was present from the beginning to the end of the case.  His presents and assistance was medically necessary for retraction of soft tissues and management of soft tissues as well as layered closure of the wound.   VA[facility] D: 03/27/2021 1:08:47 pm T: 03/27/2021 9:11:00 pm  JOB: [id number]

## 2021-03-27 NOTE — Transfer of Care (Signed)
Immediate Anesthesia Transfer of Care Note  Patient: Tracy Orozco  Procedure(s) Performed: Right TOTAL KNEE ARTHROPLASTY (Right: Knee)  Patient Location: PACU  Anesthesia Type:Spinal  Level of Consciousness: awake, alert  and oriented  Airway & Oxygen Therapy: Patient Spontanous Breathing and Patient connected to face mask oxygen  Post-op Assessment: Report given to RN and Post -op Vital signs reviewed and stable  Post vital signs: Reviewed and stable  Last Vitals:  Vitals Value Taken Time  BP    Temp    Pulse 82 03/27/21 1328  Resp 16 03/27/21 1328  SpO2 95 % 03/27/21 1328  Vitals shown include unvalidated device data.  Last Pain:  Vitals:   03/27/21 0823  TempSrc:   PainSc: 4       Patients Stated Pain Goal: 3 (03/27/21 7001)  Complications: No notable events documented.

## 2021-03-27 NOTE — Brief Op Note (Signed)
03/27/2021  1:10 PM  PATIENT:  Tracy Orozco  61 y.o. female  PRE-OPERATIVE DIAGNOSIS:  Osteoarthritis / Degenerative joint diease Right Knee  POST-OPERATIVE DIAGNOSIS:  Osteoarthritis / Degenerative joint diease Right Knee  PROCEDURE:  Procedure(s): Right TOTAL KNEE ARTHROPLASTY (Right)  SURGEON:  Surgeon(s) and Role:    Kathryne Hitch, MD - Primary  PHYSICIAN ASSISTANT: Rexene Edison, PA-C  ANESTHESIA:   regional and spinal  COUNTS:  YES  TOURNIQUET:   Total Tourniquet Time Documented: Thigh (Right) - 59 minutes Total: Thigh (Right) - 59 minutes   DICTATION: .Other Dictation: Dictation Number [id number]  PLAN OF CARE: Admit for overnight observation  PATIENT DISPOSITION:  PACU - hemodynamically stable.   Delay start of Pharmacological VTE agent (>24hrs) due to surgical blood loss or risk of bleeding: no

## 2021-03-27 NOTE — Anesthesia Preprocedure Evaluation (Signed)
Anesthesia Evaluation  Patient identified by MRN, date of birth, ID band Patient awake    Reviewed: Allergy & Precautions, NPO status , Patient's Chart, lab work & pertinent test results  History of Anesthesia Complications Negative for: history of anesthetic complications  Airway Mallampati: II  TM Distance: >3 FB Neck ROM: Full    Dental  (+) Teeth Intact   Pulmonary neg pulmonary ROS,    Pulmonary exam normal        Cardiovascular hypertension, Pt. on medications Normal cardiovascular exam     Neuro/Psych negative neurological ROS     GI/Hepatic negative GI ROS, Neg liver ROS,   Endo/Other  negative endocrine ROS  Renal/GU negative Renal ROS  negative genitourinary   Musculoskeletal  (+) Arthritis ,   Abdominal   Peds  Hematology negative hematology ROS (+)   Anesthesia Other Findings   Reproductive/Obstetrics                             Anesthesia Physical Anesthesia Plan  ASA: 2  Anesthesia Plan: Spinal   Post-op Pain Management: Regional block*, Tylenol PO (pre-op)* and Toradol IV (intra-op)*   Induction:   PONV Risk Score and Plan: 2 and Propofol infusion, Treatment may vary due to age or medical condition, Ondansetron and TIVA  Airway Management Planned: Nasal Cannula and Simple Face Mask  Additional Equipment: None  Intra-op Plan:   Post-operative Plan:   Informed Consent: I have reviewed the patients History and Physical, chart, labs and discussed the procedure including the risks, benefits and alternatives for the proposed anesthesia with the patient or authorized representative who has indicated his/her understanding and acceptance.       Plan Discussed with:   Anesthesia Plan Comments:         Anesthesia Quick Evaluation

## 2021-03-27 NOTE — Anesthesia Procedure Notes (Addendum)
Anesthesia Regional Block: Adductor canal block   Pre-Anesthetic Checklist: , timeout performed,  Correct Patient, Correct Site, Correct Laterality,  Correct Procedure, Correct Position, site marked,  Risks and benefits discussed,  Surgical consent,  Pre-op evaluation,  At surgeon's request and post-op pain management  Laterality: Right  Prep: chloraprep       Needles:  Injection technique: Single-shot  Needle Type: Echogenic Stimulator Needle     Needle Length: 10cm  Needle Gauge: 20     Additional Needles:   Procedures:,,,, ultrasound used (permanent image in chart),,    Narrative:  Start time: 03/27/2021 10:48 AM End time: 03/27/2021 10:52 AM Injection made incrementally with aspirations every 5 mL.  Performed by: Personally  Anesthesiologist: Lucretia Kern, MD  Additional Notes: Standard monitors applied. Skin prepped. Good needle visualization with ultrasound. Injection made in 5cc increments with no resistance to injection. Patient tolerated the procedure well.

## 2021-03-28 DIAGNOSIS — Z8616 Personal history of COVID-19: Secondary | ICD-10-CM | POA: Diagnosis not present

## 2021-03-28 DIAGNOSIS — Z885 Allergy status to narcotic agent status: Secondary | ICD-10-CM | POA: Diagnosis not present

## 2021-03-28 DIAGNOSIS — I10 Essential (primary) hypertension: Secondary | ICD-10-CM | POA: Diagnosis not present

## 2021-03-28 DIAGNOSIS — Z20822 Contact with and (suspected) exposure to covid-19: Secondary | ICD-10-CM | POA: Diagnosis not present

## 2021-03-28 DIAGNOSIS — Z96643 Presence of artificial hip joint, bilateral: Secondary | ICD-10-CM | POA: Diagnosis not present

## 2021-03-28 DIAGNOSIS — M1711 Unilateral primary osteoarthritis, right knee: Secondary | ICD-10-CM | POA: Diagnosis not present

## 2021-03-28 LAB — CBC
HCT: 36.4 % (ref 36.0–46.0)
Hemoglobin: 12.1 g/dL (ref 12.0–15.0)
MCH: 30.2 pg (ref 26.0–34.0)
MCHC: 33.2 g/dL (ref 30.0–36.0)
MCV: 90.8 fL (ref 80.0–100.0)
Platelets: 256 10*3/uL (ref 150–400)
RBC: 4.01 MIL/uL (ref 3.87–5.11)
RDW: 13.1 % (ref 11.5–15.5)
WBC: 12.2 10*3/uL — ABNORMAL HIGH (ref 4.0–10.5)
nRBC: 0 % (ref 0.0–0.2)

## 2021-03-28 LAB — BASIC METABOLIC PANEL
Anion gap: 7 (ref 5–15)
BUN: 18 mg/dL (ref 6–20)
CO2: 26 mmol/L (ref 22–32)
Calcium: 8.9 mg/dL (ref 8.9–10.3)
Chloride: 101 mmol/L (ref 98–111)
Creatinine, Ser: 0.71 mg/dL (ref 0.44–1.00)
GFR, Estimated: >60 mL/min (ref >60)
Glucose, Bld: 139 mg/dL — ABNORMAL HIGH (ref 70–99)
Potassium: 3.7 mmol/L (ref 3.5–5.1)
Sodium: 134 mmol/L — ABNORMAL LOW (ref 135–145)

## 2021-03-28 MED ORDER — CALCIUM GLUCONATE 500 MG PO TABS: 500.0000 mg | ORAL_TABLET | Freq: Every day | ORAL | Status: DC

## 2021-03-28 MED ORDER — OXYCODONE HCL 5 MG PO TABS
5.0000 mg | ORAL_TABLET | ORAL | 0 refills | Status: DC | PRN
Start: 2021-03-28 — End: 2021-04-02

## 2021-03-28 MED ORDER — ASPIRIN 81 MG PO CHEW: 81.0000 mg | CHEWABLE_TABLET | Freq: Two times a day (BID) | ORAL | 0 refills | Status: DC

## 2021-03-28 MED ORDER — LACTATED RINGERS IV BOLUS
500.0000 mL | Freq: Once | INTRAVENOUS | Status: AC
  Administered 2021-03-28: 500 mL via INTRAVENOUS

## 2021-03-28 MED ORDER — METHOCARBAMOL 500 MG PO TABS: 500.0000 mg | ORAL_TABLET | Freq: Four times a day (QID) | ORAL | 1 refills | Status: DC | PRN

## 2021-03-28 NOTE — Progress Notes (Signed)
Physical Therapy Treatment Patient Details Name: Tracy Orozco MRN: [id number] DOB: 01/09/1961 Today's Date: 03/28/2021   History of Present Illness Pt s/p R TKR and with hx of bil THR and spinal fusion L5 - S1    PT Comments    Pt very cooperative and with noted improved R quad strength and stability in standing but pt limited by increased pain.  This am, pt performed therex program with assist and up to ambulate limited distance in room to chair for bfast.  Will follow up after next pain meds.  Recommendations for follow up therapy are one component of a multi-disciplinary discharge planning process, led by the attending physician.  Recommendations may be updated based on patient status, additional functional criteria and insurance authorization.  Follow Up Recommendations  Follow physician's recommendations for discharge plan and follow up therapies     Assistance Recommended at Discharge Frequent or constant Supervision/Assistance  Patient can return home with the following A little help with walking and/or transfers;A little help with bathing/dressing/bathroom;Assistance with cooking/housework;Assist for transportation;Help with stairs or ramp for entrance   Equipment Recommendations  None recommended by PT    Recommendations for Other Services       Precautions / Restrictions Precautions Precautions: Fall;Knee Required Braces or Orthoses: Knee Immobilizer - Right Knee Immobilizer - Right: Discontinue once straight leg raise with < 10 degree lag (Pt performed IND SLR this am) Restrictions Weight Bearing Restrictions: No RLE Weight Bearing: Weight bearing as tolerated     Mobility  Bed Mobility Overal bed mobility: Needs Assistance Bed Mobility: Supine to Sit     Supine to sit: Min assist     General bed mobility comments: Increased time with use of bedrails and assist to manage R LE    Transfers Overall transfer level: Needs assistance Equipment used: Rolling  walker (2 wheels) Transfers: Sit to/from Stand Sit to Stand: Min assist           General transfer comment: cues for LE management and use of UEs to self assist    Ambulation/Gait Ambulation/Gait assistance: Min assist Gait Distance (Feet): 6 Feet Assistive device: Rolling walker (2 wheels) Gait Pattern/deviations: Step-to pattern, Decreased step length - right, Decreased step length - left, Shuffle, Trunk flexed Gait velocity: decr     General Gait Details: cues for sequence, posture and position from RW; distance ltd by pain   Stairs             Wheelchair Mobility    Modified Rankin (Stroke Patients Only)       Balance Overall balance assessment: Needs assistance Sitting-balance support: No upper extremity supported, Feet supported Sitting balance-Leahy Scale: Good     Standing balance support: No upper extremity supported Standing balance-Leahy Scale: Fair                              Cognition Arousal/Alertness: Awake/alert Behavior During Therapy: WFL for tasks assessed/performed Overall Cognitive Status: Within Functional Limits for tasks assessed                                          Exercises Total Joint Exercises Ankle Circles/Pumps: AROM, Both, 15 reps, Supine Quad Sets: AROM, Both, 10 reps, Supine Heel Slides: AAROM, Right, Supine, 15 reps Straight Leg Raises: AAROM, AROM, Right, 10 reps, Supine    General Comments  Pertinent Vitals/Pain Pain Assessment Pain Assessment: 0-10 Pain Score: 8  Pain Location: R knee Pain Descriptors / Indicators: Aching, Sore, Burning Pain Intervention(s): Limited activity within patient's tolerance, Monitored during session, Premedicated before session, Ice applied    Home Living                          Prior Function            PT Goals (current goals can now be found in the care plan section) Acute Rehab PT Goals Patient Stated Goal: Regain  IND PT Goal Formulation: With patient Time For Goal Achievement: 04/03/21 Potential to Achieve Goals: Good Progress towards PT goals: Progressing toward goals    Frequency    7X/week      PT Plan Current plan remains appropriate    Co-evaluation              AM-PAC PT "6 Clicks" Mobility   Outcome Measure  Help needed turning from your back to your side while in a flat bed without using bedrails?: A Little Help needed moving from lying on your back to sitting on the side of a flat bed without using bedrails?: A Little Help needed moving to and from a bed to a chair (including a wheelchair)?: A Little Help needed standing up from a chair using your arms (e.g., wheelchair or bedside chair)?: A Little Help needed to walk in hospital room?: Total Help needed climbing 3-5 steps with a railing? : Total 6 Click Score: 14    End of Session Equipment Utilized During Treatment: Gait belt Activity Tolerance: Patient limited by pain Patient left: in chair;with call bell/phone within reach;with chair alarm set Nurse Communication: Mobility status PT Visit Diagnosis: Unsteadiness on feet (R26.81);Difficulty in walking, not elsewhere classified (R26.2);Pain Pain - Right/Left: Right Pain - part of body: Knee     Time: [id number] PT Time Calculation (min) (ACUTE ONLY): 24 min  Charges:  $Gait Training: 8-22 mins $Therapeutic Exercise: 8-22 mins                     Debe Coder PT Acute Rehabilitation Services Pager [phone] Office [phone]    Tracy Orozco 03/28/2021, 12:30 PM

## 2021-03-28 NOTE — TOC Transition Note (Signed)
Transition of Care [facility]) - CM/SW Discharge Note   Patient Details  Name: Tracy Orozco MRN: [id number] Date of Birth: 07-23-1960  Transition of Care [facility]) CM/SW Contact:  Darleene Cleaver, LCSW Phone Number: 03/28/2021, 5:47 PM   Clinical Narrative:     Patient will be going home with home health through [facility] home health.  Per [facility] they will not be able to start care until Tuesday, because they have to get insurance authorization.  CSW updated bedside nurse, CSW signing off please reconsult with any other social work needs, home health agency has been notified of planned discharge.    Final next level of care: Home w Home Health Services Barriers to Discharge: Barriers Resolved   Patient Goals and CMS Choice Patient states their goals for this hospitalization and ongoing recovery are:: To go home with home health. CMS Medicare.gov Compare Post Acute Care list provided to:: Patient Choice offered to / list presented to : Patient  Discharge Placement                       Discharge Plan and Services                          HH Arranged: PT [facility] Agency: Other - See comment ([facility]) Date [facility] Agency Contacted: 03/28/21 Time HH Agency Contacted: 1500 Representative spoke with at [facility] Agency: Eber Jones  Social Determinants of Health (SDOH) Interventions     Readmission Risk Interventions No flowsheet data found.

## 2021-03-28 NOTE — Discharge Summary (Signed)
Patient ID: Tracy Orozco MRN: [id number] DOB/AGE: 09-12-1960 61 y.o.  Admit date: 03/27/2021 Discharge date: 03/28/2021  Admission Diagnoses:  Principal Problem:   Unilateral primary osteoarthritis, right knee Active Problems:   Status post total right knee replacement   Discharge Diagnoses:  Same  Past Medical History:  Diagnosis Date   Arthritis    Hypertension     Surgeries: Procedure(s): Right TOTAL KNEE ARTHROPLASTY on 03/27/2021   Consultants:   Discharged Condition: Improved  Hospital Course: Tracy Orozco is an 61 y.o. female who was admitted 03/27/2021 for operative treatment ofUnilateral primary osteoarthritis, right knee. Patient has severe unremitting pain that affects sleep, daily activities, and work/hobbies. After pre-op clearance the patient was taken to the operating room on 03/27/2021 and underwent  Procedure(s): Right TOTAL KNEE ARTHROPLASTY.    Patient was given perioperative antibiotics:  Anti-infectives (From admission, onward)    Start     Dose/Rate Route Frequency Ordered Stop   03/27/21 1800  ceFAZolin (ANCEF) IVPB 1 g/50 mL premix        1 g 100 mL/hr over 30 Minutes Intravenous Every 6 hours 03/27/21 1516 03/28/21 0031   03/27/21 0815  ceFAZolin (ANCEF) IVPB 2g/100 mL premix        2 g 200 mL/hr over 30 Minutes Intravenous On call to O.R. 03/27/21 0805 03/27/21 1136        Patient was given sequential compression devices, early ambulation, and chemoprophylaxis to prevent DVT.  Patient benefited maximally from hospital stay and there were no complications.    Recent vital signs: Patient Vitals for the past 24 hrs:  BP Temp Temp src Pulse Resp SpO2  03/28/21 0748 117/70 98.1 F (36.7 C) Oral 83 18 94 %  03/28/21 0533 114/75 97.9 F (36.6 C) Oral 85 18 95 %  03/28/21 0123 111/77 98.1 F (36.7 C) -- 82 18 95 %  03/27/21 2046 (!) 124/50 97.8 F (36.6 C) Oral 88 18 99 %  03/27/21 1720 125/67 98.1 F (36.7 C) Oral 92 16 98 %  03/27/21 1517  114/80 -- -- 68 16 100 %  03/27/21 1500 118/73 -- -- 62 12 100 %  03/27/21 1430 110/78 97.9 F (36.6 C) -- 71 15 97 %  03/27/21 1415 108/77 -- -- 68 13 97 %  03/27/21 1400 111/74 -- -- 73 11 96 %  03/27/21 1345 109/69 -- -- 78 15 96 %  03/27/21 1330 108/73 -- -- 94 20 95 %  03/27/21 1325 99/62 97.8 F (36.6 C) -- 78 17 90 %  03/27/21 1050 -- -- -- 69 13 99 %  03/27/21 1045 124/79 -- -- 68 16 97 %     Recent laboratory studies:  Recent Labs    03/28/21 0259  WBC 12.2*  HGB 12.1  HCT 36.4  PLT 256  NA 134*  K 3.7  CL 101  CO2 26  BUN 18  CREATININE 0.71  GLUCOSE 139*  CALCIUM 8.9     Discharge Medications:   Allergies as of 03/28/2021       Reactions   Morphine And Related Palpitations, Other (See Comments)   "Heart pounds".        Medication List     TAKE these medications    ascorbic acid 500 MG tablet Commonly known as: VITAMIN C Take 1 tablet (500 mg total) by mouth daily.   aspirin 81 MG chewable tablet Chew 1 tablet (81 mg total) by mouth 2 (two) times daily.   calcium gluconate 500  MG tablet Take 500 mg by mouth daily.   cetirizine 10 MG tablet Commonly known as: ZYRTEC Take 10 mg by mouth daily.   diclofenac Sodium 1 % Gel Commonly known as: VOLTAREN Apply 1 application topically daily as needed (pain).   ferrous sulfate 325 (65 FE) MG tablet Take 325 mg by mouth daily.   GLUCOSAMINE-CHONDROITIN PO Take 2 tablets by mouth daily. 1500/1200   ibuprofen 200 MG tablet Commonly known as: ADVIL Take 800 mg by mouth 3 (three) times daily.   Magnesium 250 MG Tabs Take 250 mg by mouth daily.   methocarbamol 500 MG tablet Commonly known as: ROBAXIN Take 500 mg by mouth 2 (two) times daily as needed for muscle spasms. What changed: Another medication with the same name was added. Make sure you understand how and when to take each.   methocarbamol 500 MG tablet Commonly known as: ROBAXIN Take 1 tablet (500 mg total) by mouth every 6 (six)  hours as needed for muscle spasms. What changed: You were already taking a medication with the same name, and this prescription was added. Make sure you understand how and when to take each.   multivitamin with minerals Tabs tablet Take 1 tablet by mouth daily.   oxyCODONE 5 MG immediate release tablet Commonly known as: Oxy IR/ROXICODONE Take 1-2 tablets (5-10 mg total) by mouth every 4 (four) hours as needed for moderate pain (pain score 4-6).   pseudoephedrine 30 MG tablet Commonly known as: SUDAFED Take 30 mg by mouth every 4 (four) hours as needed for congestion.   valsartan 160 MG tablet Commonly known as: DIOVAN Take 160 mg by mouth daily.   Vitamin D 50 MCG (2000 UT) tablet Take 4,000 Units by mouth daily.   zinc sulfate 220 (50 Zn) MG capsule Take 1 capsule (220 mg total) by mouth daily.               Durable Medical Equipment  (From admission, onward)           Start     Ordered   03/27/21 1517  DME 3 n 1  Once        03/27/21 1516   03/27/21 1517  DME Walker rolling  Once       Question Answer Comment  Walker: With 5 Inch Wheels   Patient needs a walker to treat with the following condition Status post total right knee replacement      03/27/21 1516            Diagnostic Studies: X-ray knee right AP and lateral  Result Date: 03/27/2021 CLINICAL DATA:  Postop right knee replacement EXAM: RIGHT KNEE - 1-2 VIEW COMPARISON:  Radiographs dated Jun 09, 2020 FINDINGS: Status post right knee arthroplasty. No periprosthetic fracture. Subcutaneous emphysema and surgical clips as expected. IMPRESSION: Status post right knee arthroplasty with postsurgical changes as expected. Electronically Signed   By: Larose Hires D.O.   On: 03/27/2021 14:21    Disposition: Discharge disposition: 01-Home or Self Care       Discharge Instructions     Discharge patient   Complete by: As directed    Discharge disposition: 01-Home or Self Care   Discharge patient  date: 03/28/2021        Follow-up Information     Kathryne Hitch, MD Follow up in 2 week(s).   Specialty: Orthopedic Surgery Contact information: [address] [place] [address] [phone]  Signed: Kathryne Hitch 03/28/2021, 10:02 AM

## 2021-03-28 NOTE — Progress Notes (Signed)
Physical Therapy Treatment Patient Details Name: Tracy Orozco MRN: [id number] DOB: 06/04/1960 Today's Date: 03/28/2021   History of Present Illness Pt s/p R TKR and with hx of bil THR and spinal fusion L5 - S1    PT Comments    Pt continues very cooperative but limited by dizziness with attempts to ambulate.  Pt assisted bed to [facility] for toileting and ambulated 4' to stand at bedside with c/o dizziness - BP standing 73/34 - RN present.  Pt assisted to bed.   Recommendations for follow up therapy are one component of a multi-disciplinary discharge planning process, led by the attending physician.  Recommendations may be updated based on patient status, additional functional criteria and insurance authorization.  Follow Up Recommendations  Follow physician's recommendations for discharge plan and follow up therapies     Assistance Recommended at Discharge Frequent or constant Supervision/Assistance  Patient can return home with the following A little help with walking and/or transfers;A little help with bathing/dressing/bathroom;Assistance with cooking/housework;Assist for transportation;Help with stairs or ramp for entrance   Equipment Recommendations  None recommended by PT    Recommendations for Other Services       Precautions / Restrictions Precautions Precautions: Fall;Knee Required Braces or Orthoses: Knee Immobilizer - Right Knee Immobilizer - Right: Discontinue once straight leg raise with < 10 degree lag Restrictions Weight Bearing Restrictions: No RLE Weight Bearing: Weight bearing as tolerated     Mobility  Bed Mobility Overal bed mobility: Needs Assistance Bed Mobility: Sit to Supine     Supine to sit: Min assist Sit to supine: Min assist, Mod assist   General bed mobility comments: cues for sequence and assist for LEs    Transfers Overall transfer level: Needs assistance Equipment used: Rolling walker (2 wheels) Transfers: Sit to/from Stand, Bed to  chair/wheelchair/BSC Sit to Stand: Min assist   Step pivot transfers: Min assist       General transfer comment: cues for LE management and use of UEs to self assist; step pvt with RW recliner to [facility]    Ambulation/Gait Ambulation/Gait assistance: Min assist Gait Distance (Feet): 4 Feet Assistive device: Rolling walker (2 wheels) Gait Pattern/deviations: Step-to pattern, Decreased step length - right, Decreased step length - left, Shuffle, Trunk flexed Gait velocity: decr     General Gait Details: cues for sequence, posture and position from RW; distance ltd by dizziness   Stairs             Wheelchair Mobility    Modified Rankin (Stroke Patients Only)       Balance Overall balance assessment: Needs assistance Sitting-balance support: No upper extremity supported, Feet supported Sitting balance-Leahy Scale: Good     Standing balance support: Bilateral upper extremity supported Standing balance-Leahy Scale: Poor                              Cognition Arousal/Alertness: Awake/alert Behavior During Therapy: WFL for tasks assessed/performed Overall Cognitive Status: Within Functional Limits for tasks assessed                                          Exercises Total Joint Exercises Ankle Circles/Pumps: AROM, Both, 15 reps, Supine Quad Sets: AROM, Both, 10 reps, Supine Heel Slides: AAROM, Right, Supine, 15 reps Straight Leg Raises: AAROM, AROM, Right, 10 reps, Supine    General Comments  Pertinent Vitals/Pain Pain Assessment Pain Assessment: 0-10 Pain Score: 8  Pain Location: R knee Pain Descriptors / Indicators: Aching, Sore, Burning Pain Intervention(s): Limited activity within patient's tolerance, Monitored during session, Premedicated before session, Ice applied    Home Living                          Prior Function            PT Goals (current goals can now be found in the care plan section)  Acute Rehab PT Goals Patient Stated Goal: Regain IND PT Goal Formulation: With patient Time For Goal Achievement: 04/03/21 Potential to Achieve Goals: Good Progress towards PT goals: Not progressing toward goals - comment (orthostatic)    Frequency    7X/week      PT Plan Current plan remains appropriate    Co-evaluation              AM-PAC PT "6 Clicks" Mobility   Outcome Measure  Help needed turning from your back to your side while in a flat bed without using bedrails?: A Little Help needed moving from lying on your back to sitting on the side of a flat bed without using bedrails?: A Little Help needed moving to and from a bed to a chair (including a wheelchair)?: A Little Help needed standing up from a chair using your arms (e.g., wheelchair or bedside chair)?: A Little Help needed to walk in hospital room?: Total Help needed climbing 3-5 steps with a railing? : Total 6 Click Score: 14    End of Session Equipment Utilized During Treatment: Gait belt Activity Tolerance: Patient limited by pain;Other (comment) (orthostatic) Patient left: in bed;with call bell/phone within reach;with nursing/sitter in room Nurse Communication: Mobility status PT Visit Diagnosis: Unsteadiness on feet (R26.81);Difficulty in walking, not elsewhere classified (R26.2);Pain Pain - Right/Left: Right Pain - part of body: Knee     Time: [id number] PT Time Calculation (min) (ACUTE ONLY): 24 min  Charges:  $Gait Training: 8-22 mins $Therapeutic Activity: 8-22 mins                     Mauro Kaufmann PT Acute Rehabilitation Services Pager [phone] Office [phone]    Tracy Orozco 03/28/2021, 4:26 PM

## 2021-03-28 NOTE — Progress Notes (Signed)
Physical Therapy Treatment Patient Details Name: Tracy Orozco MRN: [id number] DOB: [birth year] Today's Date: 03/28/2021   History of Present Illness Pt s/p R TKR and with hx of bil THR and spinal fusion L5 - S1    PT Comments    Pt up to ambulated but very limited distance 2* onset dizziness and returned to chair - BP 71/40.  RN in room to assess.   Recommendations for follow up therapy are one component of a multi-disciplinary discharge planning process, led by the attending physician.  Recommendations may be updated based on patient status, additional functional criteria and insurance authorization.  Follow Up Recommendations  Follow physician's recommendations for discharge plan and follow up therapies     Assistance Recommended at Discharge Frequent or constant Supervision/Assistance  Patient can return home with the following A little help with walking and/or transfers;A little help with bathing/dressing/bathroom;Assistance with cooking/housework;Assist for transportation;Help with stairs or ramp for entrance   Equipment Recommendations  None recommended by PT    Recommendations for Other Services       Precautions / Restrictions Precautions Precautions: Fall;Knee Required Braces or Orthoses: Knee Immobilizer - Right Knee Immobilizer - Right: Discontinue once straight leg raise with < 10 degree lag Restrictions Weight Bearing Restrictions: No RLE Weight Bearing: Weight bearing as tolerated     Mobility  Bed Mobility Overal bed mobility: Needs Assistance Bed Mobility: Supine to Sit     Supine to sit: Min assist     General bed mobility comments: Pt up in chair and returns to same`    Transfers Overall transfer level: Needs assistance Equipment used: Rolling walker (2 wheels) Transfers: Sit to/from Stand Sit to Stand: Min assist           General transfer comment: cues for LE management and use of UEs to self assist    Ambulation/Gait Ambulation/Gait  assistance: Min assist Gait Distance (Feet): 7 Feet Assistive device: Rolling walker (2 wheels) Gait Pattern/deviations: Step-to pattern, Decreased step length - right, Decreased step length - left, Shuffle, Trunk flexed Gait velocity: decr     General Gait Details: cues for sequence, posture and position from RW; distance ltd by dizziness   Stairs             Wheelchair Mobility    Modified Rankin (Stroke Patients Only)       Balance Overall balance assessment: Needs assistance Sitting-balance support: No upper extremity supported, Feet supported Sitting balance-Leahy Scale: Good     Standing balance support: Bilateral upper extremity supported Standing balance-Leahy Scale: Poor                              Cognition Arousal/Alertness: Awake/alert Behavior During Therapy: WFL for tasks assessed/performed Overall Cognitive Status: Within Functional Limits for tasks assessed                                          Exercises Total Joint Exercises Ankle Circles/Pumps: AROM, Both, 15 reps, Supine Quad Sets: AROM, Both, 10 reps, Supine Heel Slides: AAROM, Right, Supine, 15 reps Straight Leg Raises: AAROM, AROM, Right, 10 reps, Supine    General Comments        Pertinent Vitals/Pain Pain Assessment Pain Assessment: 0-10 Pain Score: 7  Pain Location: R knee Pain Descriptors / Indicators: Aching, Sore, Burning Pain Intervention(s): Limited activity within patient's  tolerance, Monitored during session, Premedicated before session, Ice applied    Home Living                          Prior Function            PT Goals (current goals can now be found in the care plan section) Acute Rehab PT Goals Patient Stated Goal: Regain IND PT Goal Formulation: With patient Time For Goal Achievement: 04/03/21 Potential to Achieve Goals: Good Progress towards PT goals: Progressing toward goals    Frequency     7X/week      PT Plan Current plan remains appropriate    Co-evaluation              AM-PAC PT "6 Clicks" Mobility   Outcome Measure  Help needed turning from your back to your side while in a flat bed without using bedrails?: A Little Help needed moving from lying on your back to sitting on the side of a flat bed without using bedrails?: A Little Help needed moving to and from a bed to a chair (including a wheelchair)?: A Little Help needed standing up from a chair using your arms (e.g., wheelchair or bedside chair)?: A Little Help needed to walk in hospital room?: Total Help needed climbing 3-5 steps with a railing? : Total 6 Click Score: 14    End of Session Equipment Utilized During Treatment: Gait belt Activity Tolerance: Patient limited by pain;Other (comment) (dizziness) Patient left: in chair;with call bell/phone within reach;with chair alarm set;with nursing/sitter in room Nurse Communication: Mobility status PT Visit Diagnosis: Unsteadiness on feet (R26.81);Difficulty in walking, not elsewhere classified (R26.2);Pain Pain - Right/Left: Right Pain - part of body: Knee     Time: 9562-1308 PT Time Calculation (min) (ACUTE ONLY): 23 min  Charges:  $Gait Training: 8-22 mins $Therapeutic Exercise: 8-22 mins                     Mauro Kaufmann PT Acute Rehabilitation Services Pager [phone] Office [phone]    [facility] 03/28/2021, 12:35 PM

## 2021-03-28 NOTE — Progress Notes (Signed)
Subjective: 1 Day Post-Op Procedure(s) (LRB): Right TOTAL KNEE ARTHROPLASTY (Right) Patient reports pain as moderate.    Objective: Vital signs in last 24 hours: Temp:  [97.8 F (36.6 C)-98.1 F (36.7 C)] 98.1 F (36.7 C) (02/25 0748) Pulse Rate:  [62-94] 83 (02/25 0748) Resp:  [11-20] 18 (02/25 0748) BP: (99-125)/(50-80) 117/70 (02/25 0748) SpO2:  [90 %-100 %] 94 % (02/25 0748)  Intake/Output from previous day: 02/24 0701 - 02/25 0700 In: 3565.3 [P.O.:1200; I.V.:2115.3; IV Piggyback:250] Out: 2950 [Urine:2850; Blood:100] Intake/Output this shift: No intake/output data recorded.  Recent Labs    03/28/21 0259  HGB 12.1   Recent Labs    03/28/21 0259  WBC 12.2*  RBC 4.01  HCT 36.4  PLT 256   Recent Labs    03/28/21 0259  NA 134*  K 3.7  CL 101  CO2 26  BUN 18  CREATININE 0.71  GLUCOSE 139*  CALCIUM 8.9   No results for input(s): LABPT, INR in the last 72 hours.  Sensation intact distally Intact pulses distally Dorsiflexion/Plantar flexion intact Incision: dressing C/D/I No cellulitis present Compartment soft   Assessment/Plan: 1 Day Post-Op Procedure(s) (LRB): Right TOTAL KNEE ARTHROPLASTY (Right) Up with therapy Discharge home with home health      Kathryne Hitch 03/28/2021, 10:00 AM

## 2021-03-28 NOTE — Plan of Care (Signed)
  Problem: Coping: Goal: Level of anxiety will decrease Outcome: Progressing   Problem: Pain Managment: Goal: General experience of comfort will improve Outcome: Progressing   

## 2021-03-28 NOTE — Progress Notes (Signed)
Discharge package printed and instructions given to patient. Patient verbalizes understanding. 

## 2021-03-28 NOTE — TOC Progression Note (Addendum)
Transition of Care [facility]) - Progression Note    Patient Details  Name: Tracy Orozco MRN: [id number] Date of Birth: 07-15-1960  Transition of [facility] CM/SW Contact  Ross Ludwig, [place] Phone Number: 03/28/2021, 4:09 PM  Clinical Narrative:    CSW spoke to patient, she does not need any DME.  CSW worked on trying to get home health set up.  CSW called [facility], [facility], [place], [place], who all declined be able ot accept patient.  CSW contacted [facility], they can accept patient, however they will not be able to see patient until Tuesday because they need to get insurance authorization.  CSW updated bedside nurse.       Expected Discharge Plan and [place] with home health         Expected Discharge Date: 03/28/21                                     Social Determinants of Health (SDOH) Interventions    Readmission Risk Interventions No flowsheet data found.

## 2021-03-28 NOTE — Plan of Care (Signed)
  Problem: Pain Managment: Goal: General experience of comfort will improve Outcome: Progressing   Problem: Safety: Goal: Ability to remain free from injury will improve Outcome: Progressing   

## 2021-03-28 NOTE — Discharge Instructions (Signed)

## 2021-03-29 DIAGNOSIS — Z8616 Personal history of COVID-19: Secondary | ICD-10-CM | POA: Diagnosis not present

## 2021-03-29 DIAGNOSIS — Z885 Allergy status to narcotic agent status: Secondary | ICD-10-CM | POA: Diagnosis not present

## 2021-03-29 DIAGNOSIS — I10 Essential (primary) hypertension: Secondary | ICD-10-CM | POA: Diagnosis not present

## 2021-03-29 DIAGNOSIS — Z96643 Presence of artificial hip joint, bilateral: Secondary | ICD-10-CM | POA: Diagnosis not present

## 2021-03-29 DIAGNOSIS — M1711 Unilateral primary osteoarthritis, right knee: Secondary | ICD-10-CM | POA: Diagnosis not present

## 2021-03-29 DIAGNOSIS — Z20822 Contact with and (suspected) exposure to covid-19: Secondary | ICD-10-CM | POA: Diagnosis not present

## 2021-03-29 LAB — CBC WITH DIFFERENTIAL/PLATELET
Abs Immature Granulocytes: 0.04 10*3/uL (ref 0.00–0.07)
Basophils Absolute: 0.1 10*3/uL (ref 0.0–0.1)
Basophils Relative: 1 %
Eosinophils Absolute: 0.2 10*3/uL (ref 0.0–0.5)
Eosinophils Relative: 2 %
HCT: 34.4 % — ABNORMAL LOW (ref 36.0–46.0)
Hemoglobin: 11.3 g/dL — ABNORMAL LOW (ref 12.0–15.0)
Immature Granulocytes: 0 %
Lymphocytes Relative: 13 %
Lymphs Abs: 1.3 10*3/uL (ref 0.7–4.0)
MCH: 29.8 pg (ref 26.0–34.0)
MCHC: 32.8 g/dL (ref 30.0–36.0)
MCV: 90.8 fL (ref 80.0–100.0)
Monocytes Absolute: 1 10*3/uL (ref 0.1–1.0)
Monocytes Relative: 10 %
Neutro Abs: 7.7 10*3/uL (ref 1.7–7.7)
Neutrophils Relative %: 74 %
Platelets: 217 10*3/uL (ref 150–400)
RBC: 3.79 MIL/uL — ABNORMAL LOW (ref 3.87–5.11)
RDW: 13.2 % (ref 11.5–15.5)
WBC: 10.2 10*3/uL (ref 4.0–10.5)
nRBC: 0 % (ref 0.0–0.2)

## 2021-03-29 LAB — BASIC METABOLIC PANEL
Anion gap: 5 (ref 5–15)
BUN: 13 mg/dL (ref 6–20)
CO2: 28 mmol/L (ref 22–32)
Calcium: 8.6 mg/dL — ABNORMAL LOW (ref 8.9–10.3)
Chloride: 102 mmol/L (ref 98–111)
Creatinine, Ser: 0.61 mg/dL (ref 0.44–1.00)
GFR, Estimated: >60 mL/min (ref >60)
Glucose, Bld: 128 mg/dL — ABNORMAL HIGH (ref 70–99)
Potassium: 3.3 mmol/L — ABNORMAL LOW (ref 3.5–5.1)
Sodium: 135 mmol/L (ref 135–145)

## 2021-03-29 NOTE — Progress Notes (Signed)
Patient has walker at home, doesn't need commode (has raised toilet seats)

## 2021-03-29 NOTE — Progress Notes (Signed)
° ° ° °  Subjective: 2 Days Post-Op Procedure(s) (LRB): Right TOTAL KNEE ARTHROPLASTY (Right) 3 episodes of orthostatic hypotension with dizziness and feeling faint. Given fluid bolus and IVF for over a liter of fluid. She has improved her BP and participated with PT without any  Orthostatic complaints of dizziness or Hypotension.  Patient reports pain as moderate.    Objective:   VITALS:  Temp:  [98 F (36.7 C)-99.2 F (37.3 C)] 99 F (37.2 C) (02/26 0539) Pulse Rate:  [84-100] 98 (02/26 0539) Resp:  [16-19] 19 (02/26 0539) BP: (109-146)/(57-83) 133/83 (02/26 0539) SpO2:  [96 %-100 %] 96 % (02/26 0539)  Neurologically intact ABD soft Neurovascular intact Sensation intact distally Intact pulses distally Dorsiflexion/Plantar flexion intact Incision: dressing C/D/I and no drainage   LABS Recent Labs    03/28/21 0259 03/29/21 1004  HGB 12.1 11.3*  WBC 12.2* 10.2  PLT 256 217   Recent Labs    03/28/21 0259 03/29/21 1004  NA 134* 135  K 3.7 3.3*  CL 101 102  CO2 26 28  BUN 18 13  CREATININE 0.71 0.61  GLUCOSE 139* 128*   No results for input(s): LABPT, INR in the last 72 hours.   Assessment/Plan: 2 Days Post-Op Procedure(s) (LRB): Right TOTAL KNEE ARTHROPLASTY (Right) Orthostatic hypotension, likely due to hypovolemia from intraop blood loss and antiHTN agents. Much better today, she is stable.   Advance diet Up with therapy D/C IV fluids Discharge home with home health  Tracy Orozco 03/29/2021, 12:27 PM Patient ID: Tracy Orozco, female   DOB: March 06, 1960, 61 y.o.   MRN: [id number]

## 2021-03-29 NOTE — Plan of Care (Signed)
Careplans complete ° °

## 2021-03-29 NOTE — Progress Notes (Signed)
Physical Therapy Treatment Patient Details Name: Tracy Orozco MRN: [id number] DOB: 1960/06/14 Today's Date: 03/29/2021   History of Present Illness Pt s/p R TKR and with hx of bil THR and spinal fusion L5 - S1    PT Comments    Pt continues very motivated and with moderate progress noted with mobility.  Pt performed therex program with assist, assisted bed to [facility] for toileting and ambulated limited distance in hall.  No c/o dizziness.  Orthostatic BP performed with readings provided to and recorded by RN.  Pt pleased with progress after difficult day yesterday.   Recommendations for follow up therapy are one component of a multi-disciplinary discharge planning process, led by the attending physician.  Recommendations may be updated based on patient status, additional functional criteria and insurance authorization.  Follow Up Recommendations  Follow physician's recommendations for discharge plan and follow up therapies     Assistance Recommended at Discharge Frequent or constant Supervision/Assistance  Patient can return home with the following A little help with walking and/or transfers;A little help with bathing/dressing/bathroom;Assistance with cooking/housework;Assist for transportation;Help with stairs or ramp for entrance   Equipment Recommendations  None recommended by PT    Recommendations for Other Services       Precautions / Restrictions Precautions Precautions: Fall;Knee Required Braces or Orthoses: Knee Immobilizer - Right Knee Immobilizer - Right: Discontinue once straight leg raise with < 10 degree lag (IND SLR this am) Restrictions Weight Bearing Restrictions: No RLE Weight Bearing: Weight bearing as tolerated     Mobility  Bed Mobility Overal bed mobility: Needs Assistance Bed Mobility: Supine to Sit     Supine to sit: Min guard     General bed mobility comments: Increased time with min guard for R LE    Transfers Overall transfer level: Needs  assistance Equipment used: Rolling walker (2 wheels) Transfers: Sit to/from Stand, Bed to chair/wheelchair/BSC Sit to Stand: Min guard   Step pivot transfers: Min guard       General transfer comment: Steady assist with cues for LE management and use of UEs to self assist; step pvt with RW bed to [facility]    Ambulation/Gait Ambulation/Gait assistance: Min assist Gait Distance (Feet): 50 Feet Assistive device: Rolling walker (2 wheels) Gait Pattern/deviations: Step-to pattern, Decreased step length - right, Decreased step length - left, Shuffle, Trunk flexed Gait velocity: decr     General Gait Details: cues for sequence, posture and position from RW; distance ltd by fatigue   Stairs             Wheelchair Mobility    Modified Rankin (Stroke Patients Only)       Balance Overall balance assessment: Needs assistance Sitting-balance support: No upper extremity supported, Feet supported Sitting balance-Leahy Scale: Good     Standing balance support: No upper extremity supported Standing balance-Leahy Scale: Fair                              Cognition Arousal/Alertness: Awake/alert Behavior During Therapy: WFL for tasks assessed/performed Overall Cognitive Status: Within Functional Limits for tasks assessed                                          Exercises Total Joint Exercises Ankle Circles/Pumps: AROM, Both, 15 reps, Supine Quad Sets: AROM, Both, 10 reps, Supine Heel Slides: AAROM, Right, Supine,  20 reps Straight Leg Raises: AAROM, AROM, Right, Supine, 20 reps Goniometric ROM: -5 - 50    General Comments        Pertinent Vitals/Pain Pain Assessment Pain Assessment: 0-10 Pain Score: 8  Pain Location: R knee Pain Descriptors / Indicators: Aching, Sore, Burning Pain Intervention(s): Limited activity within patient's tolerance, Monitored during session, Premedicated before session, Ice applied    Home Living                           Prior Function            PT Goals (current goals can now be found in the care plan section) Acute Rehab PT Goals Patient Stated Goal: Regain IND PT Goal Formulation: With patient Time For Goal Achievement: 04/03/21 Potential to Achieve Goals: Good Progress towards PT goals: Progressing toward goals    Frequency    7X/week      PT Plan Current plan remains appropriate    Co-evaluation              AM-PAC PT "6 Clicks" Mobility   Outcome Measure  Help needed turning from your back to your side while in a flat bed without using bedrails?: A Little Help needed moving from lying on your back to sitting on the side of a flat bed without using bedrails?: A Little Help needed moving to and from a bed to a chair (including a wheelchair)?: A Little Help needed standing up from a chair using your arms (e.g., wheelchair or bedside chair)?: A Little Help needed to walk in hospital room?: A Little Help needed climbing 3-5 steps with a railing? : A Lot 6 Click Score: 17    End of Session Equipment Utilized During Treatment: Gait belt Activity Tolerance: Patient limited by pain;Patient limited by fatigue Patient left: in chair;with call bell/phone within reach Nurse Communication: Mobility status PT Visit Diagnosis: Unsteadiness on feet (R26.81);Difficulty in walking, not elsewhere classified (R26.2);Pain Pain - Right/Left: Right Pain - part of body: Knee     Time: 1884-1660 PT Time Calculation (min) (ACUTE ONLY): 50 min  Charges:  $Gait Training: 8-22 mins $Therapeutic Exercise: 8-22 mins $Therapeutic Activity: 8-22 mins                     Tracy Orozco PT Acute Rehabilitation Services Pager [phone] Office [phone]    Tracy Orozco 03/29/2021, 11:49 AM

## 2021-03-29 NOTE — Progress Notes (Signed)
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  Pt up to ambulate 100' this pm and feeling more comfortable with dc home this date.  Will follow up with stair training and HEP after pt has chance to rest and eat lunch.   Recommendations for follow up therapy are one component of a multi-disciplinary discharge planning process, led by the attending physician.  Recommendations may be updated based on patient status, additional functional criteria and insurance authorization.  Follow Up Recommendations  Follow physician's recommendations for discharge plan and follow up therapies     Assistance Recommended at Discharge Frequent or constant Supervision/Assistance  Patient can return home with the following A little help with walking and/or transfers;A little help with bathing/dressing/bathroom;Assistance with cooking/housework;Assist for transportation;Help with stairs or ramp for entrance   Equipment Recommendations  None recommended by PT    Recommendations for Other Services       Precautions / Restrictions Precautions Precautions: Fall;Knee Required Braces or Orthoses: Knee Immobilizer - Right Knee Immobilizer - Right: Discontinue once straight leg raise with < 10 degree lag Restrictions Weight Bearing Restrictions: No RLE Weight Bearing: Weight bearing as tolerated     Mobility  Bed Mobility Overal bed mobility: Needs Assistance Bed Mobility: Supine to Sit     Supine to sit: Min guard     General bed mobility comments: Up in chair and returns to same    Transfers Overall transfer level: Needs assistance Equipment used: Rolling walker (2 wheels) Transfers: Sit to/from Stand Sit to Stand: Min guard,  Supervision   Step pivot transfers: Min guard       General transfer comment: Pt self cues for use of UEs and LE management    Ambulation/Gait Ambulation/Gait assistance: Min guard Gait Distance (Feet): 100 Feet Assistive device: Rolling walker (2 wheels) Gait Pattern/deviations: Step-to pattern, Decreased step length - right, Decreased step length - left, Shuffle, Trunk flexed Gait velocity: decr     General Gait Details: cues for sequence, posture and position from RW; distance ltd by fatigue   Stairs             Wheelchair Mobility    Modified Rankin (Stroke Patients Only)       Balance Overall balance assessment: Needs assistance Sitting-balance support: No upper extremity supported, Feet supported Sitting balance-Leahy Scale: Good     Standing balance support: No upper extremity supported Standing balance-Leahy Scale: Fair                              Cognition Arousal/Alertness: Awake/alert Behavior During Therapy: WFL for tasks assessed/performed Overall Cognitive Status: Within Functional Limits for tasks assessed                                          Exercises Total Joint Exercises Ankle Circles/Pumps: AROM, Both, 15 reps, Supine Quad Sets: AROM, Both, 10 reps, Supine Heel Slides: AAROM, Right, Supine, 20 reps Straight Leg Raises: AAROM, AROM, Right, Supine, 20 reps Goniometric ROM: -5 - 50    General Comments  Pertinent Vitals/Pain Pain Assessment Pain Assessment: 0-10 Pain Score: 7  Pain Location: R knee Pain Descriptors / Indicators: Aching, Sore, Burning Pain Intervention(s): Limited activity within patient's tolerance, Monitored during session, Premedicated before session, Ice applied    Home Living                          Prior Function            PT Goals (current goals can now be found in the care plan section) Acute Rehab PT Goals Patient Stated Goal: Regain IND PT Goal  Formulation: With patient Time For Goal Achievement: 04/03/21 Potential to Achieve Goals: Good Progress towards PT goals: Progressing toward goals    Frequency    7X/week      PT Plan Current plan remains appropriate    Co-evaluation              AM-PAC PT "6 Clicks" Mobility   Outcome Measure  Help needed turning from your back to your side while in a flat bed without using bedrails?: A Little Help needed moving from lying on your back to sitting on the side of a flat bed without using bedrails?: A Little Help needed moving to and from a bed to a chair (including a wheelchair)?: A Little Help needed standing up from a chair using your arms (e.g., wheelchair or bedside chair)?: A Little Help needed to walk in hospital room?: A Little Help needed climbing 3-5 steps with a railing? : A Lot 6 Click Score: 17    End of Session Equipment Utilized During Treatment: Gait belt Activity Tolerance: Patient limited by pain;Patient limited by fatigue Patient left: in chair;with call bell/phone within reach Nurse Communication: Mobility status PT Visit Diagnosis: Unsteadiness on feet (R26.81);Difficulty in walking, not elsewhere classified (R26.2);Pain Pain - Right/Left: Right Pain - part of body: Knee     Time: [id number] PT Time Calculation (min) (ACUTE ONLY): 18 min  Charges:  $Gait Training: 8-22 mins                     [place] Pager [phone] Office [phone]    Levar Fayson 03/29/2021, 4:12 PM

## 2021-03-29 NOTE — Plan of Care (Signed)
  Problem: Education: Goal: Knowledge of General Education information will improve Description: Including pain rating scale, medication(s)/side effects and non-pharmacologic comfort measures Outcome: Progressing   Problem: Activity: Goal: Risk for activity intolerance will decrease Outcome: Progressing   Problem: Pain Managment: Goal: General experience of comfort will improve Outcome: Progressing   

## 2021-03-29 NOTE — Progress Notes (Signed)
Physical Therapy Treatment Patient Details Name: Tracy Orozco MRN: [id number] DOB: 1960/06/14 Today's Date: 03/29/2021   History of Present Illness Pt s/p R TKR and with hx of bil THR and spinal fusion L5 - S1    PT Comments    Pt continues to progress with mobility.  Pt up to ambulate limited distance in hall, negotiated stairs and performed HEP with written instruction provided and reviewed.  Pt feels ready to dc home this date with assist of family.   Recommendations for follow up therapy are one component of a multi-disciplinary discharge planning process, led by the attending physician.  Recommendations may be updated based on patient status, additional functional criteria and insurance authorization.  Follow Up Recommendations  Follow physician's recommendations for discharge plan and follow up therapies     Assistance Recommended at Discharge Frequent or constant Supervision/Assistance  Patient can return home with the following A little help with walking and/or transfers;A little help with bathing/dressing/bathroom;Assistance with cooking/housework;Assist for transportation;Help with stairs or ramp for entrance   Equipment Recommendations  None recommended by PT    Recommendations for Other Services       Precautions / Restrictions Precautions Precautions: Fall;Knee Required Braces or Orthoses: Knee Immobilizer - Right Knee Immobilizer - Right: Discontinue once straight leg raise with < 10 degree lag Restrictions Weight Bearing Restrictions: No RLE Weight Bearing: Weight bearing as tolerated     Mobility  Bed Mobility Overal bed mobility: Needs Assistance Bed Mobility: Sit to Supine     Supine to sit: Min guard Sit to supine: Min guard   General bed mobility comments: cues for sequence with min guard for safety to manage R LE    Transfers Overall transfer level: Needs assistance Equipment used: Rolling walker (2 wheels) Transfers: Sit to/from Stand, Bed to  chair/wheelchair/BSC Sit to Stand: Supervision   Step pivot transfers: Min guard       General transfer comment: Pt self cues for use of UEs and LE management.  Step pvt recliner to [facility]    Ambulation/Gait Ambulation/Gait assistance: Min guard, Supervision Gait Distance (Feet): 50 Feet Assistive device: Rolling walker (2 wheels) Gait Pattern/deviations: Step-to pattern, Decreased step length - right, Decreased step length - left, Shuffle, Trunk flexed Gait velocity: decr     General Gait Details: min cues for sequence, posture and position from RW;   Stairs Stairs: Yes Stairs assistance: Min assist Stair Management: No rails Number of Stairs: 2 General stair comments: cues for sequence and foot/RW placement   Wheelchair Mobility    Modified Rankin (Stroke Patients Only)       Balance Overall balance assessment: Needs assistance Sitting-balance support: No upper extremity supported, Feet supported Sitting balance-Leahy Scale: Good     Standing balance support: No upper extremity supported Standing balance-Leahy Scale: Fair                              Cognition Arousal/Alertness: Awake/alert Behavior During Therapy: WFL for tasks assessed/performed Overall Cognitive Status: Within Functional Limits for tasks assessed                                          Exercises Total Joint Exercises Ankle Circles/Pumps: AROM, Both, 15 reps, Supine Quad Sets: AROM, Both, Supine, 5 reps Short Arc Quad: AAROM, Right, 10 reps, Supine Heel Slides: AAROM, Right, Supine,  10 reps Hip ABduction/ADduction: AAROM, Right, 10 reps, Supine Straight Leg Raises: AAROM, AROM, Right, Supine, 5 reps Goniometric ROM: -5 - 50    General Comments        Pertinent Vitals/Pain Pain Assessment Pain Assessment: 0-10 Pain Score: 7  Pain Location: R knee Pain Descriptors / Indicators: Aching, Sore, Burning Pain Intervention(s): Limited activity within  patient's tolerance, Monitored during session, Premedicated before session, Ice applied    Home Living                          Prior Function            PT Goals (current goals can now be found in the care plan section) Acute Rehab PT Goals Patient Stated Goal: Regain IND PT Goal Formulation: With patient Time For Goal Achievement: 04/03/21 Potential to Achieve Goals: Good Progress towards PT goals: Progressing toward goals    Frequency    7X/week      PT Plan Current plan remains appropriate    Co-evaluation              AM-PAC PT "6 Clicks" Mobility   Outcome Measure  Help needed turning from your back to your side while in a flat bed without using bedrails?: A Little Help needed moving from lying on your back to sitting on the side of a flat bed without using bedrails?: A Little Help needed moving to and from a bed to a chair (including a wheelchair)?: A Little Help needed standing up from a chair using your arms (e.g., wheelchair or bedside chair)?: A Little Help needed to walk in hospital room?: A Little Help needed climbing 3-5 steps with a railing? : A Little 6 Click Score: 18    End of Session Equipment Utilized During Treatment: Gait belt Activity Tolerance: Patient limited by pain;Patient limited by fatigue;Patient tolerated treatment well Patient left: in bed;with call bell/phone within reach;with bed alarm set Nurse Communication: Mobility status PT Visit Diagnosis: Unsteadiness on feet (R26.81);Difficulty in walking, not elsewhere classified (R26.2);Pain Pain - Right/Left: Right Pain - part of body: Knee     Time: 1445-1516 PT Time Calculation (min) (ACUTE ONLY): 31 min  Charges:  $Gait Training: 8-22 mins $Therapeutic Exercise: 8-22 mins                     Mauro Kaufmann PT Acute Rehabilitation Services Pager [phone] Office [phone]    Nakshatra Klose 03/29/2021, 4:20 PM

## 2021-03-30 ENCOUNTER — Telehealth: Payer: Self-pay | Admitting: Radiology

## 2021-03-30 ENCOUNTER — Other Ambulatory Visit: Payer: Self-pay

## 2021-03-30 ENCOUNTER — Encounter (HOSPITAL_COMMUNITY): Payer: Self-pay | Admitting: Orthopaedic Surgery

## 2021-03-30 DIAGNOSIS — Z96651 Presence of right artificial knee joint: Secondary | ICD-10-CM

## 2021-03-30 NOTE — Telephone Encounter (Signed)
Faxed to provided number  

## 2021-03-30 NOTE — Telephone Encounter (Signed)
Tracy Orozco with [facility] called and states that the social worker from the hospital did not put in for patient to have HHPT. She needs an order signed by Dr. Magnus Ivan faxed to her today so that they can see the patient tomorrow.  Fax-[phone]

## 2021-03-30 NOTE — Progress Notes (Signed)
°   03/28/21 1200  Assess: MEWS Score  BP (!) 71/40 (dizziness, silver spots and light head)  Pulse Rate 81  Resp 16  Level of Consciousness Alert  SpO2 100 %  O2 Device Room Air  Patient Activity (if Appropriate) In chair  Assess: MEWS Score  MEWS Temp 0  MEWS Systolic 2  MEWS Pulse 0  MEWS RR 0  MEWS LOC 0  MEWS Score 2  MEWS Score Color Yellow  Assess: if the MEWS score is Yellow or Red  Were vital signs taken at a resting state? Yes  Focused Assessment No change from prior assessment  Does the patient meet 2 or more of the SIRS criteria? No  MEWS guidelines implemented *See Row Information* Yes  Treat  MEWS Interventions Other (Comment) (Notified MD and recheck BP)  Pain Scale 0-10  Pain Score 0  Pain Type Acute pain  Pain Location Knee  Pain Orientation Right  Pain Descriptors / Indicators Aching;Discomfort  Pain Frequency Intermittent  Pain Onset With Activity  Take Vital Signs  Increase Vital Sign Frequency  Yellow: Q 2hr X 2 then Q 4hr X 2, if remains yellow, continue Q 4hrs  Escalate  MEWS: Escalate Yellow: discuss with charge nurse/RN and consider discussing with provider and RRT  Notify: Charge Nurse/RN  Name of Charge Nurse/RN Notified Marni Griffon, RN  Date Charge Nurse/RN Notified 03/28/21  Time Charge Nurse/RN Notified 1210  Notify: Provider  Provider Name/Title Otelia Sergeant  Date Provider Notified 03/28/21  Time Provider Notified 1210  Notification Type Page  Notification Reason  (No order. To recheck BP again and get pt up slowly)  Provider response No new orders  Date of Provider Response 03/28/21  Time of Provider Response 1215  Assess: SIRS CRITERIA  SIRS Temperature  0  SIRS Pulse 0  SIRS Respirations  0  SIRS WBC 1  SIRS Score Sum  1

## 2021-03-31 DIAGNOSIS — Z471 Aftercare following joint replacement surgery: Secondary | ICD-10-CM | POA: Diagnosis not present

## 2021-03-31 DIAGNOSIS — Z8616 Personal history of COVID-19: Secondary | ICD-10-CM | POA: Diagnosis not present

## 2021-03-31 DIAGNOSIS — Z96651 Presence of right artificial knee joint: Secondary | ICD-10-CM | POA: Diagnosis not present

## 2021-03-31 DIAGNOSIS — M1990 Unspecified osteoarthritis, unspecified site: Secondary | ICD-10-CM | POA: Diagnosis not present

## 2021-03-31 DIAGNOSIS — I10 Essential (primary) hypertension: Secondary | ICD-10-CM | POA: Diagnosis not present

## 2021-03-31 DIAGNOSIS — Z7982 Long term (current) use of aspirin: Secondary | ICD-10-CM | POA: Diagnosis not present

## 2021-04-01 ENCOUNTER — Telehealth: Payer: Self-pay | Admitting: Orthopaedic Surgery

## 2021-04-01 NOTE — Telephone Encounter (Signed)
Called and gave verbal ok for therapy ?

## 2021-04-01 NOTE — Telephone Encounter (Signed)
Received call from [place] (PT) with [facility] needing verbal orders for HHPT 3 WK 1 and 2 WK 1. The number to contact Olegario Messier is [phone] ?

## 2021-04-02 ENCOUNTER — Other Ambulatory Visit: Payer: Self-pay | Admitting: Orthopaedic Surgery

## 2021-04-02 ENCOUNTER — Telehealth: Payer: Self-pay | Admitting: Orthopaedic Surgery

## 2021-04-02 DIAGNOSIS — Z471 Aftercare following joint replacement surgery: Secondary | ICD-10-CM | POA: Diagnosis not present

## 2021-04-02 DIAGNOSIS — M1990 Unspecified osteoarthritis, unspecified site: Secondary | ICD-10-CM | POA: Diagnosis not present

## 2021-04-02 DIAGNOSIS — Z8616 Personal history of COVID-19: Secondary | ICD-10-CM | POA: Diagnosis not present

## 2021-04-02 DIAGNOSIS — I10 Essential (primary) hypertension: Secondary | ICD-10-CM | POA: Diagnosis not present

## 2021-04-02 DIAGNOSIS — Z7982 Long term (current) use of aspirin: Secondary | ICD-10-CM | POA: Diagnosis not present

## 2021-04-02 DIAGNOSIS — Z96651 Presence of right artificial knee joint: Secondary | ICD-10-CM | POA: Diagnosis not present

## 2021-04-02 MED ORDER — OXYCODONE HCL 5 MG PO TABS
5.0000 mg | ORAL_TABLET | ORAL | 0 refills | Status: DC | PRN
Start: 2021-04-02 — End: 2021-04-06

## 2021-04-02 NOTE — Telephone Encounter (Signed)
Please advise 

## 2021-04-02 NOTE — Telephone Encounter (Signed)
Pt called requesting a refill of oxycodone. Please send to pharmacy on file. Pt phone number is [phone]. ?

## 2021-04-03 DIAGNOSIS — Z8616 Personal history of COVID-19: Secondary | ICD-10-CM | POA: Diagnosis not present

## 2021-04-03 DIAGNOSIS — Z471 Aftercare following joint replacement surgery: Secondary | ICD-10-CM | POA: Diagnosis not present

## 2021-04-03 DIAGNOSIS — Z96651 Presence of right artificial knee joint: Secondary | ICD-10-CM | POA: Diagnosis not present

## 2021-04-03 DIAGNOSIS — M1990 Unspecified osteoarthritis, unspecified site: Secondary | ICD-10-CM | POA: Diagnosis not present

## 2021-04-03 DIAGNOSIS — Z7982 Long term (current) use of aspirin: Secondary | ICD-10-CM | POA: Diagnosis not present

## 2021-04-03 DIAGNOSIS — I10 Essential (primary) hypertension: Secondary | ICD-10-CM | POA: Diagnosis not present

## 2021-04-06 ENCOUNTER — Telehealth: Payer: Self-pay

## 2021-04-06 ENCOUNTER — Other Ambulatory Visit: Payer: Self-pay | Admitting: Orthopaedic Surgery

## 2021-04-06 DIAGNOSIS — Z8616 Personal history of COVID-19: Secondary | ICD-10-CM | POA: Diagnosis not present

## 2021-04-06 DIAGNOSIS — Z7982 Long term (current) use of aspirin: Secondary | ICD-10-CM | POA: Diagnosis not present

## 2021-04-06 DIAGNOSIS — Z471 Aftercare following joint replacement surgery: Secondary | ICD-10-CM | POA: Diagnosis not present

## 2021-04-06 DIAGNOSIS — I10 Essential (primary) hypertension: Secondary | ICD-10-CM | POA: Diagnosis not present

## 2021-04-06 DIAGNOSIS — Z96651 Presence of right artificial knee joint: Secondary | ICD-10-CM | POA: Diagnosis not present

## 2021-04-06 DIAGNOSIS — M1990 Unspecified osteoarthritis, unspecified site: Secondary | ICD-10-CM | POA: Diagnosis not present

## 2021-04-06 MED ORDER — OXYCODONE HCL 5 MG PO TABS: 5.0000 mg | ORAL_TABLET | ORAL | 0 refills | Status: DC | PRN

## 2021-04-06 NOTE — Telephone Encounter (Signed)
Called and advised.

## 2021-04-06 NOTE — Telephone Encounter (Signed)
Patient called and would like to know if she can have her pain mediation refilled and she would like to know if she can have it sent to the [facility] on [address]  ?

## 2021-04-06 NOTE — Telephone Encounter (Signed)
Please advise 

## 2021-04-07 ENCOUNTER — Telehealth: Payer: Self-pay | Admitting: Orthopaedic Surgery

## 2021-04-07 NOTE — Telephone Encounter (Signed)
Called and gave verbal ok for continued PT 

## 2021-04-07 NOTE — Telephone Encounter (Signed)
Tracy Orozco the therapist called and is requesting orders to cont home health for one more week due to pt not having transportation?  ? ?CB [phone]  ?

## 2021-04-08 DIAGNOSIS — Z7982 Long term (current) use of aspirin: Secondary | ICD-10-CM | POA: Diagnosis not present

## 2021-04-08 DIAGNOSIS — Z471 Aftercare following joint replacement surgery: Secondary | ICD-10-CM | POA: Diagnosis not present

## 2021-04-08 DIAGNOSIS — Z96651 Presence of right artificial knee joint: Secondary | ICD-10-CM | POA: Diagnosis not present

## 2021-04-08 DIAGNOSIS — M1990 Unspecified osteoarthritis, unspecified site: Secondary | ICD-10-CM | POA: Diagnosis not present

## 2021-04-08 DIAGNOSIS — I10 Essential (primary) hypertension: Secondary | ICD-10-CM | POA: Diagnosis not present

## 2021-04-08 DIAGNOSIS — Z8616 Personal history of COVID-19: Secondary | ICD-10-CM | POA: Diagnosis not present

## 2021-04-09 ENCOUNTER — Other Ambulatory Visit: Payer: Self-pay

## 2021-04-09 ENCOUNTER — Ambulatory Visit (INDEPENDENT_AMBULATORY_CARE_PROVIDER_SITE_OTHER): Payer: BC Managed Care – PPO | Admitting: Orthopaedic Surgery

## 2021-04-09 ENCOUNTER — Encounter: Payer: Self-pay | Admitting: Orthopaedic Surgery

## 2021-04-09 DIAGNOSIS — Z96651 Presence of right artificial knee joint: Secondary | ICD-10-CM

## 2021-04-09 MED ORDER — OXYCODONE HCL 5 MG PO TABS: 5.0000 mg | ORAL_TABLET | Freq: Four times a day (QID) | ORAL | 0 refills | Status: DC | PRN

## 2021-04-09 NOTE — Progress Notes (Signed)
The patient is 2 weeks status post a right knee replacement.  She is having some soreness in her muscles but does report good range of motion and strength overall.  She is very pleased with her home health physical therapist.  She would like to transition after a week to outpatient therapy with Ellamae Sia.  I gave her a prescription for that.  She has been compliant with a baby aspirin twice daily.  She does need a refill of oxycodone but she is using it sparingly.  She reports that she has been able to get her knee flexed to about 87 degrees. ? ?Her right knee incision looks good.  We remove the staples and place Steri-Strips.  Her extension is full and her flexion is to almost 90 degrees.  Her calf is soft and there is no foot and ankle swelling. ? ?I will send in some more oxycodone.  Again I gave her prescription for outpatient physical therapy with Ellamae Sia.  I will see her back in 4 weeks to see how she is doing overall but no x-rays are needed.  At that point we can likely get her back to work if she is ready. ?

## 2021-04-13 ENCOUNTER — Telehealth: Payer: Self-pay | Admitting: Orthopaedic Surgery

## 2021-04-13 NOTE — Telephone Encounter (Signed)
Patient called. She would like to know when will it be safe to put heat on and also when can she sleep in a different position. Her call back number is [phone] ?

## 2021-04-13 NOTE — Telephone Encounter (Signed)
Called and advised. Pt stated understanding  °

## 2021-04-14 DIAGNOSIS — Z7982 Long term (current) use of aspirin: Secondary | ICD-10-CM | POA: Diagnosis not present

## 2021-04-14 DIAGNOSIS — I10 Essential (primary) hypertension: Secondary | ICD-10-CM | POA: Diagnosis not present

## 2021-04-14 DIAGNOSIS — Z471 Aftercare following joint replacement surgery: Secondary | ICD-10-CM | POA: Diagnosis not present

## 2021-04-14 DIAGNOSIS — M1990 Unspecified osteoarthritis, unspecified site: Secondary | ICD-10-CM | POA: Diagnosis not present

## 2021-04-14 DIAGNOSIS — Z96651 Presence of right artificial knee joint: Secondary | ICD-10-CM | POA: Diagnosis not present

## 2021-04-14 DIAGNOSIS — Z8616 Personal history of COVID-19: Secondary | ICD-10-CM | POA: Diagnosis not present

## 2021-04-16 DIAGNOSIS — Z471 Aftercare following joint replacement surgery: Secondary | ICD-10-CM | POA: Diagnosis not present

## 2021-04-16 DIAGNOSIS — Z8616 Personal history of COVID-19: Secondary | ICD-10-CM | POA: Diagnosis not present

## 2021-04-16 DIAGNOSIS — Z7982 Long term (current) use of aspirin: Secondary | ICD-10-CM | POA: Diagnosis not present

## 2021-04-16 DIAGNOSIS — I10 Essential (primary) hypertension: Secondary | ICD-10-CM | POA: Diagnosis not present

## 2021-04-16 DIAGNOSIS — M1990 Unspecified osteoarthritis, unspecified site: Secondary | ICD-10-CM | POA: Diagnosis not present

## 2021-04-16 DIAGNOSIS — Z96651 Presence of right artificial knee joint: Secondary | ICD-10-CM | POA: Diagnosis not present

## 2021-04-17 DIAGNOSIS — M25561 Pain in right knee: Secondary | ICD-10-CM | POA: Diagnosis not present

## 2021-04-17 DIAGNOSIS — R269 Unspecified abnormalities of gait and mobility: Secondary | ICD-10-CM | POA: Diagnosis not present

## 2021-04-20 ENCOUNTER — Other Ambulatory Visit: Payer: Self-pay | Admitting: Physician Assistant

## 2021-04-20 ENCOUNTER — Telehealth: Payer: Self-pay | Admitting: Physician Assistant

## 2021-04-20 MED ORDER — OXYCODONE HCL 5 MG PO TABS: 5.0000 mg | ORAL_TABLET | Freq: Four times a day (QID) | ORAL | 0 refills | Status: DC | PRN

## 2021-04-20 NOTE — Telephone Encounter (Signed)
Pt called requesting a refill of oxycodone. Please send to pharmacy on file. Pt phone number is [phone]. ?

## 2021-04-20 NOTE — Telephone Encounter (Signed)
Please advise 

## 2021-04-20 NOTE — Telephone Encounter (Signed)
Called and advised.

## 2021-04-22 DIAGNOSIS — R269 Unspecified abnormalities of gait and mobility: Secondary | ICD-10-CM | POA: Diagnosis not present

## 2021-04-22 DIAGNOSIS — M25561 Pain in right knee: Secondary | ICD-10-CM | POA: Diagnosis not present

## 2021-04-24 DIAGNOSIS — R269 Unspecified abnormalities of gait and mobility: Secondary | ICD-10-CM | POA: Diagnosis not present

## 2021-04-24 DIAGNOSIS — M25561 Pain in right knee: Secondary | ICD-10-CM | POA: Diagnosis not present

## 2021-04-30 DIAGNOSIS — M25561 Pain in right knee: Secondary | ICD-10-CM | POA: Diagnosis not present

## 2021-04-30 DIAGNOSIS — R269 Unspecified abnormalities of gait and mobility: Secondary | ICD-10-CM | POA: Diagnosis not present

## 2021-05-04 ENCOUNTER — Telehealth: Payer: Self-pay | Admitting: Physician Assistant

## 2021-05-04 NOTE — Telephone Encounter (Signed)
Pt called and informed. She stated understanding  ?

## 2021-05-04 NOTE — Telephone Encounter (Signed)
Pt called requesting a call back. Pt states she has had knee replacement surgery is asking for permission to drive starting next week. Pt states is not taking the narcotic meds. Please call pt at  [phone]. ?

## 2021-05-06 DIAGNOSIS — R269 Unspecified abnormalities of gait and mobility: Secondary | ICD-10-CM | POA: Diagnosis not present

## 2021-05-06 DIAGNOSIS — M25561 Pain in right knee: Secondary | ICD-10-CM | POA: Diagnosis not present

## 2021-05-07 ENCOUNTER — Encounter: Payer: Self-pay | Admitting: Orthopaedic Surgery

## 2021-05-07 ENCOUNTER — Ambulatory Visit (INDEPENDENT_AMBULATORY_CARE_PROVIDER_SITE_OTHER): Payer: BC Managed Care – PPO | Admitting: Orthopaedic Surgery

## 2021-05-07 DIAGNOSIS — Z96651 Presence of right artificial knee joint: Secondary | ICD-10-CM

## 2021-05-07 MED ORDER — OXYCODONE HCL 5 MG PO TABS: 5.0000 mg | ORAL_TABLET | Freq: Three times a day (TID) | ORAL | 0 refills | Status: DC | PRN

## 2021-05-07 NOTE — Progress Notes (Signed)
The patient is a 61 year old female who is now 6 weeks status post a right total knee arthroplasty.  She is way far ahead of most of our patients at this point in terms of her motion.  Her motion is entirely full with the right knee.  She is working on increasing her strength as well.  She has been attending physical therapy with Ellamae Sia.  She does need one-time refill of her pain medications.  She is also ready to go back to work tomorrow.  She is driving and not taking pain medicines while she drives.  She denies any calf pain but does have occasional ankle pain.  She is ambulating occasion with a cane and I think this is still reasonable. ? ?She has excellent range of motion of her right knee with some mild swelling.  It feels ligamentously stable. ? ?From my standpoint since she is doing so well I do not need to see her after 6 months unless there are issues.  We will have a standing AP and lateral of her right knee at that visit. ?

## 2021-05-14 DIAGNOSIS — R269 Unspecified abnormalities of gait and mobility: Secondary | ICD-10-CM | POA: Diagnosis not present

## 2021-05-14 DIAGNOSIS — M25561 Pain in right knee: Secondary | ICD-10-CM | POA: Diagnosis not present

## 2021-05-21 DIAGNOSIS — R269 Unspecified abnormalities of gait and mobility: Secondary | ICD-10-CM | POA: Diagnosis not present

## 2021-05-21 DIAGNOSIS — M25561 Pain in right knee: Secondary | ICD-10-CM | POA: Diagnosis not present

## 2021-05-28 DIAGNOSIS — M25561 Pain in right knee: Secondary | ICD-10-CM | POA: Diagnosis not present

## 2021-05-28 DIAGNOSIS — M8589 Other specified disorders of bone density and structure, multiple sites: Secondary | ICD-10-CM | POA: Diagnosis not present

## 2021-05-28 DIAGNOSIS — R269 Unspecified abnormalities of gait and mobility: Secondary | ICD-10-CM | POA: Diagnosis not present

## 2021-05-29 DIAGNOSIS — R946 Abnormal results of thyroid function studies: Secondary | ICD-10-CM | POA: Diagnosis not present

## 2021-05-29 DIAGNOSIS — E559 Vitamin D deficiency, unspecified: Secondary | ICD-10-CM | POA: Diagnosis not present

## 2021-05-29 DIAGNOSIS — I10 Essential (primary) hypertension: Secondary | ICD-10-CM | POA: Diagnosis not present

## 2021-06-01 DIAGNOSIS — Z1212 Encounter for screening for malignant neoplasm of rectum: Secondary | ICD-10-CM | POA: Diagnosis not present

## 2021-06-01 DIAGNOSIS — I10 Essential (primary) hypertension: Secondary | ICD-10-CM | POA: Diagnosis not present

## 2021-06-01 DIAGNOSIS — R82998 Other abnormal findings in urine: Secondary | ICD-10-CM | POA: Diagnosis not present

## 2021-06-01 DIAGNOSIS — Z0000 Encounter for general adult medical examination without abnormal findings: Secondary | ICD-10-CM | POA: Diagnosis not present

## 2021-06-11 DIAGNOSIS — R269 Unspecified abnormalities of gait and mobility: Secondary | ICD-10-CM | POA: Diagnosis not present

## 2021-06-11 DIAGNOSIS — M25561 Pain in right knee: Secondary | ICD-10-CM | POA: Diagnosis not present

## 2021-06-19 ENCOUNTER — Other Ambulatory Visit: Payer: Self-pay | Admitting: Internal Medicine

## 2021-06-19 DIAGNOSIS — Z1231 Encounter for screening mammogram for malignant neoplasm of breast: Secondary | ICD-10-CM

## 2021-07-02 ENCOUNTER — Ambulatory Visit
Admission: EM | Admit: 2021-07-02 | Discharge: 2021-07-02 | Disposition: A | Discharge status: Home / Self Care | Payer: BC Managed Care – PPO | Attending: Family Medicine | Admitting: Family Medicine

## 2021-07-02 ENCOUNTER — Ambulatory Visit (INDEPENDENT_AMBULATORY_CARE_PROVIDER_SITE_OTHER): Payer: BC Managed Care – PPO

## 2021-07-02 DIAGNOSIS — R079 Chest pain, unspecified: Secondary | ICD-10-CM

## 2021-07-02 MED ORDER — BENZONATATE 100 MG PO CAPS
100.0000 mg | ORAL_CAPSULE | Freq: Three times a day (TID) | ORAL | 0 refills | Status: DC | PRN
Start: 2021-07-02 — End: 2023-02-14

## 2021-07-02 MED ORDER — KETOROLAC TROMETHAMINE 30 MG/ML IJ SOLN
30.0000 mg | Freq: Once | INTRAMUSCULAR | Status: AC
  Administered 2021-07-02: 30 mg via INTRAMUSCULAR
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
## 2021-07-02 NOTE — Discharge Instructions (Addendum)
Your chest x-ray did not show any fractures  You have been given a shot of Toradol 30 mg today.  Take benzonatate 100 mg, 1 tab every 8 hours as needed for cough.
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
## 2021-10-19 DIAGNOSIS — L814 Other melanin hyperpigmentation: Secondary | ICD-10-CM | POA: Diagnosis not present

## 2021-10-19 DIAGNOSIS — B351 Tinea unguium: Secondary | ICD-10-CM | POA: Diagnosis not present

## 2021-10-19 DIAGNOSIS — D1801 Hemangioma of skin and subcutaneous tissue: Secondary | ICD-10-CM | POA: Diagnosis not present

## 2021-10-19 DIAGNOSIS — L821 Other seborrheic keratosis: Secondary | ICD-10-CM | POA: Diagnosis not present

## 2021-11-05 ENCOUNTER — Encounter: Payer: Self-pay | Admitting: Orthopaedic Surgery

## 2021-11-05 ENCOUNTER — Ambulatory Visit (INDEPENDENT_AMBULATORY_CARE_PROVIDER_SITE_OTHER): Payer: BC Managed Care – PPO

## 2021-11-05 ENCOUNTER — Ambulatory Visit (INDEPENDENT_AMBULATORY_CARE_PROVIDER_SITE_OTHER): Payer: BC Managed Care – PPO | Admitting: Orthopaedic Surgery

## 2021-11-05 DIAGNOSIS — Z96651 Presence of right artificial knee joint: Secondary | ICD-10-CM
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
## 2021-11-11 IMAGING — DX DG CHEST 1V PORT
1 series · 1 of 1 positions shown · non-contrast
Comparison: None.

CLINICAL DATA: Cough and fatigue with decreased oxygen saturation

EXAM:
PORTABLE CHEST 1 VIEW

[chest ap]
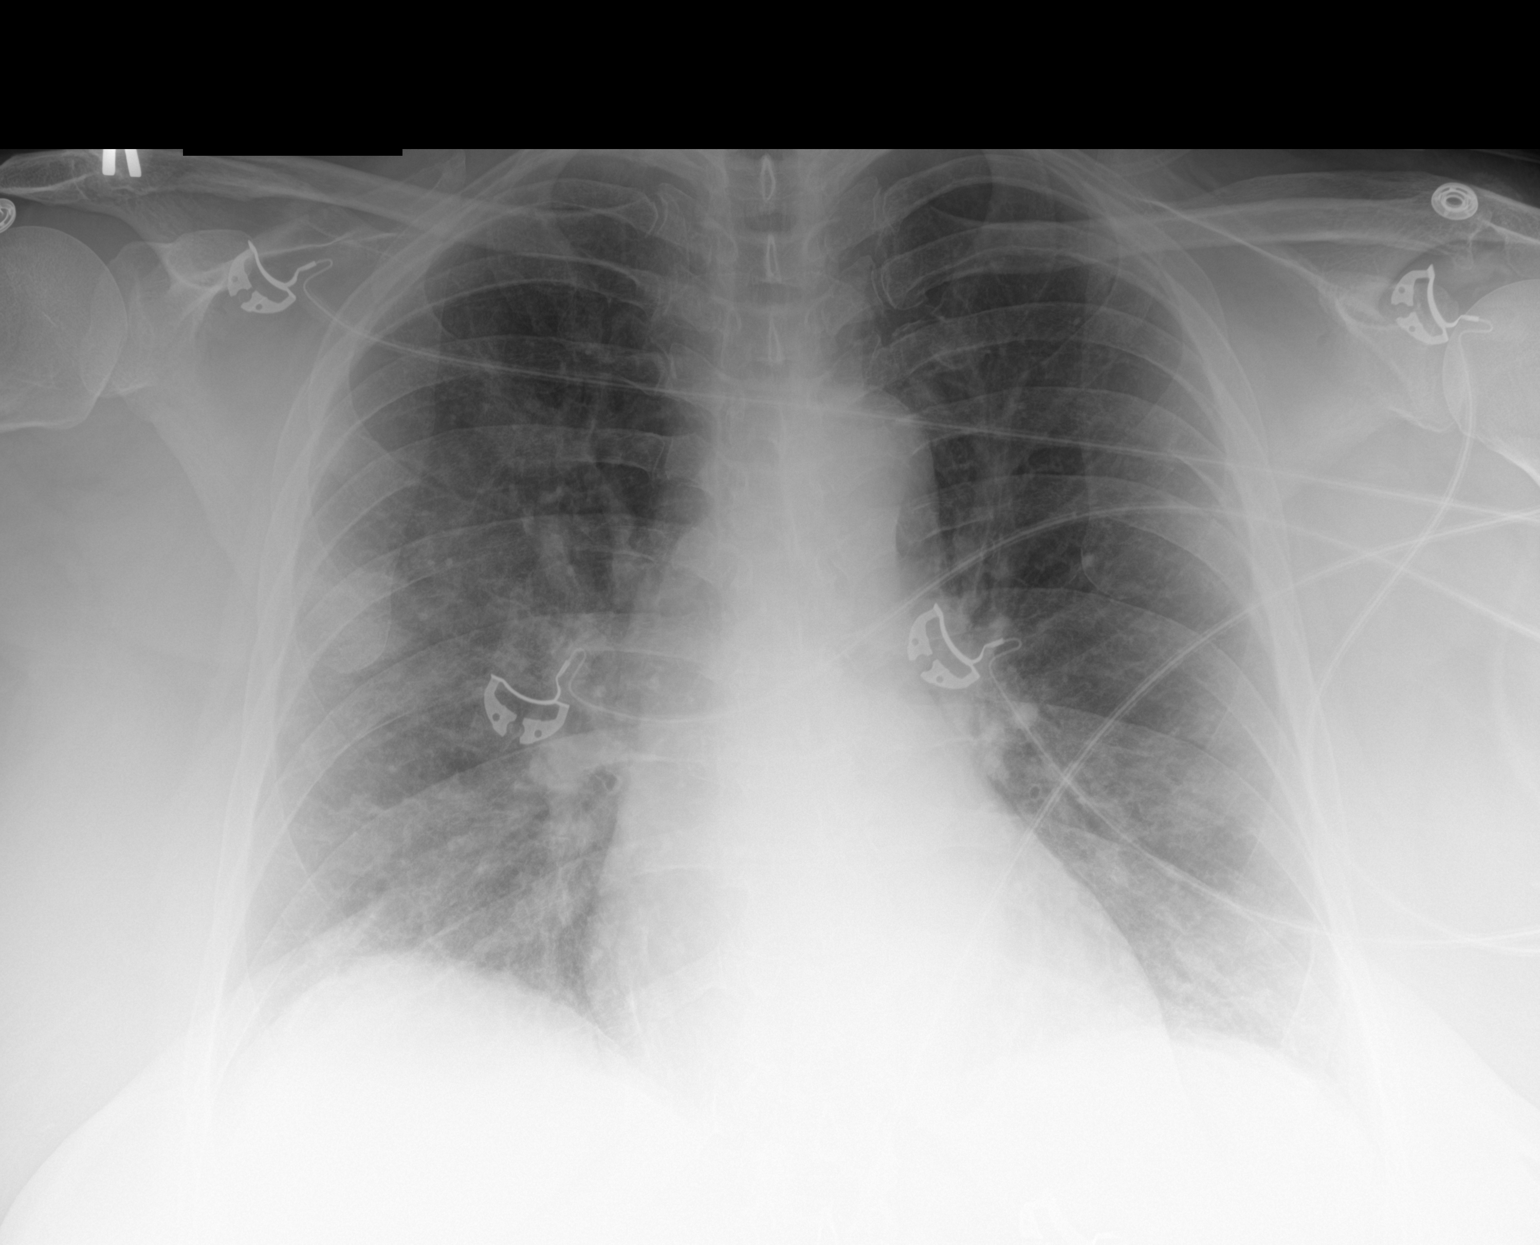

[1 of 1 positions shown; findings below may reference images not displayed]

FINDINGS: Lungs are clear. Heart size and pulmonary vascularity are normal. No
adenopathy. No bone lesions.
IMPRESSION: Lungs clear.  Cardiac silhouette within normal limits.

## 2022-04-08 ENCOUNTER — Encounter: Payer: Self-pay | Admitting: Radiology

## 2022-05-06 ENCOUNTER — Other Ambulatory Visit (INDEPENDENT_AMBULATORY_CARE_PROVIDER_SITE_OTHER): Payer: BC Managed Care – PPO

## 2022-05-06 ENCOUNTER — Ambulatory Visit: Payer: BC Managed Care – PPO | Admitting: Orthopaedic Surgery

## 2022-05-06 ENCOUNTER — Encounter: Payer: Self-pay | Admitting: Orthopaedic Surgery

## 2022-05-06 DIAGNOSIS — Z96651 Presence of right artificial knee joint: Secondary | ICD-10-CM

## 2022-05-06 DIAGNOSIS — M79642 Pain in left hand: Secondary | ICD-10-CM

## 2022-05-06 DIAGNOSIS — Z96641 Presence of right artificial hip joint: Secondary | ICD-10-CM

## 2022-05-06 MED ORDER — METHYLPREDNISOLONE ACETATE 40 MG/ML IJ SUSP
40.0000 mg | INTRAMUSCULAR | Status: AC | PRN
Start: 2022-05-06 — End: 2022-05-06
  Administered 2022-05-06: 40 mg

## 2022-05-06 MED ORDER — LIDOCAINE HCL 1 % IJ SOLN
1.0000 mL | INTRAMUSCULAR | Status: AC | PRN
Start: 2022-05-06 — End: 2022-05-06
  Administered 2022-05-06: 1 mL

## 2022-05-06 NOTE — Progress Notes (Signed)
The patient comes in today for follow-up.  She is over a year out from a right total knee arthroplasty and said the right knee has done very well for her.  She has known arthritis in her left knee but of more significance is the history she has of bilateral hip replacements done many years ago.  The left hip was replaced over 30 years ago and the right hip was replaced over 20 years ago.  Those surgeons have since retired.  She has been having worsening right hip pain.  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  She does not report any significant left hip pain.  The right hip has been hurting worse recently.  She has had no evidence of infection and no fever or chills.  She is also been dealing with left hand pain and points to the basilar thumb joint as a source of her pain over the left hand.  There is been numbness and tingling in both her hands and feet and she is talk with Dr. Osborne Casco her primary care physician about this because it is only gone on since she had COVID and this may be an effective long-term COVID.  She is not a diabetic.  She also has some clicking for right hand middle finger over the MCP joint at the sagittal band area.  This may going on for several months.  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  She is right-hand dominant.  Examination of her right knee she has no effusion.  Her knee shows full range of motion and is ligamentously stable.  X-rays of the right knee show well-seated total knee arthroplasty with no complicating features.  Examination of both hips showed they move smoothly and fluidly but she does have a Trendelenburg gait.  An AP pelvis and lateral of the right hip shows bilateral total hip arthroplasties.  The right hip has a very large femoral head that I have  seen and metal-on-metal implants.  There is no evidence of failure of the acetabular or femoral component.  There is no evidence of loosening.  Her left hip on the AP view does show a small femoral head and a superior position with an acetabulum indicative of polyliner wear and we have seen this on her past x-rays.  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  Her right hand has some clicking over the ulnar sagittal band of the middle finger.  She did request a steroid injection around the basilar thumb joint on the left side and I agreed to this and she tolerated well.  Will eventually need to have her seen by hand specialist for her basilar thumb joint pain.  I would like to obtain a three-phase bone scan to rule out prosthetic loosening as a relates to her hips.  We will send off information to [place] to see if we can get any information on the implants for her right hip.  From what I understand, her left hip implant information is no longer available.  She understands that her hip situation is much more complicated and I would need to send her to someone with more experience on complex hip revisions based on her  having posterior hip surgery in the past and based on what I am seeing with these implants.  All of my experience has been anterior hip surgery and I do not feel that it would be appropriate to consider any revision surgery through the front on her hips as they are now.  When we do see her back in 3 weeks, I would like 3 views of the left hand.      Procedure Note  Patient: Tracy Orozco             Date of Birth: 02/07/1960           MRN: [id number]             Visit Date: 05/06/2022  Procedures: Visit Diagnoses:  1. History of total right knee replacement   2. History of total replacement of right hip   3. Pain in left hand     Hand/UE Inj: L thumb CMC for osteoarthritis on 05/06/2022 5:01 PM Medications: 1 mL lidocaine 1 %;  40 mg methylPREDNISolone acetate 40 MG/ML

## 2022-05-07 ENCOUNTER — Other Ambulatory Visit: Payer: Self-pay

## 2022-05-07 DIAGNOSIS — Z96642 Presence of left artificial hip joint: Secondary | ICD-10-CM

## 2022-05-07 DIAGNOSIS — Z96641 Presence of right artificial hip joint: Secondary | ICD-10-CM

## 2022-05-11 ENCOUNTER — Telehealth: Payer: Self-pay | Admitting: Orthopaedic Surgery

## 2022-05-11 NOTE — Telephone Encounter (Signed)
The request for records from [facility] is being returned stating no records found. I faxed twice for both 2008 and 2000. IC patient and advised of the above. She will reach out to them and call me back.

## 2022-05-20 ENCOUNTER — Encounter (HOSPITAL_COMMUNITY)
Admission: RE | Admit: 2022-05-20 | Discharge: 2022-05-20 | Disposition: A | Discharge status: Home / Self Care | Payer: BC Managed Care – PPO | Source: Ambulatory Visit | Attending: Orthopaedic Surgery | Admitting: Orthopaedic Surgery

## 2022-05-20 DIAGNOSIS — Z96642 Presence of left artificial hip joint: Secondary | ICD-10-CM

## 2022-05-20 DIAGNOSIS — Z96641 Presence of right artificial hip joint: Secondary | ICD-10-CM

## 2022-05-20 MED ORDER — TECHNETIUM TC 99M MEDRONATE IV KIT
20.0000 | PACK | Freq: Once | INTRAVENOUS | Status: AC | PRN
  Administered 2022-05-20: 20.2 via INTRAVENOUS

## 2022-05-31 ENCOUNTER — Encounter: Payer: Self-pay | Admitting: Orthopaedic Surgery

## 2022-05-31 ENCOUNTER — Ambulatory Visit: Payer: BC Managed Care – PPO | Admitting: Orthopaedic Surgery

## 2022-05-31 ENCOUNTER — Other Ambulatory Visit (INDEPENDENT_AMBULATORY_CARE_PROVIDER_SITE_OTHER): Payer: BC Managed Care – PPO

## 2022-05-31 DIAGNOSIS — M79642 Pain in left hand: Secondary | ICD-10-CM

## 2022-05-31 NOTE — Progress Notes (Signed)
The patient is well-known to me.  She is over a year out from a right knee replacement.  She has a history of both [place] many [place].  We sent her for a three-phase bone scan and it does show aseptic loosening of the femoral component on the right hip and has been hip is more symptomatic for her.  The components themselves appear to be a large femoral head and likely a metal-on-metal type of liner.  The right hip femoral component is press-fit.  The left hip femoral component is cemented.  That hip has a smaller hip ball and it is high riding within the acetabulum showing poly wear.  The three-phase bone scan did not show any loosening of the prosthesis on her left hip.  She has been dealing with left hand basilar thumb joint pain for some time now.  An injection that I placed in that hand had no help in terms of the pain she is having.  She does a lot of repetitive activities with her hand.  There is still pain to the base of the left thumb.  3 views of the left thumb and hand show significant basilar thumb joint arthritis of the [facility] joint.  She is interested in seeing a hand specialist once he is established and in town later this summer.  She will call for an appoint with him.  She is going to check with her original operating surgeon in terms of what the components or company of the components may be for her hips.  She is able to to give him my email address to send any information.  She understands that her hip situation is quite complicated and I would need to refer her to a specialist who performs posterior hip surgery because I would not feel comfortable revising this to the front being an anterior hip surgery.

## 2022-06-04 DIAGNOSIS — Z1212 Encounter for screening for malignant neoplasm of rectum: Secondary | ICD-10-CM | POA: Diagnosis not present

## 2022-06-04 DIAGNOSIS — R946 Abnormal results of thyroid function studies: Secondary | ICD-10-CM | POA: Diagnosis not present

## 2022-06-04 DIAGNOSIS — E559 Vitamin D deficiency, unspecified: Secondary | ICD-10-CM | POA: Diagnosis not present

## 2022-06-04 DIAGNOSIS — R7989 Other specified abnormal findings of blood chemistry: Secondary | ICD-10-CM | POA: Diagnosis not present

## 2022-06-04 DIAGNOSIS — I10 Essential (primary) hypertension: Secondary | ICD-10-CM | POA: Diagnosis not present

## 2022-06-04 DIAGNOSIS — E7800 Pure hypercholesterolemia, unspecified: Secondary | ICD-10-CM | POA: Diagnosis not present

## 2022-06-04 DIAGNOSIS — K219 Gastro-esophageal reflux disease without esophagitis: Secondary | ICD-10-CM | POA: Diagnosis not present

## 2022-06-07 DIAGNOSIS — Z1331 Encounter for screening for depression: Secondary | ICD-10-CM | POA: Diagnosis not present

## 2022-06-07 DIAGNOSIS — Z0000 Encounter for general adult medical examination without abnormal findings: Secondary | ICD-10-CM | POA: Diagnosis not present

## 2022-06-07 DIAGNOSIS — J302 Other seasonal allergic rhinitis: Secondary | ICD-10-CM | POA: Diagnosis not present

## 2022-06-07 DIAGNOSIS — R82998 Other abnormal findings in urine: Secondary | ICD-10-CM | POA: Diagnosis not present

## 2022-06-07 DIAGNOSIS — G44209 Tension-type headache, unspecified, not intractable: Secondary | ICD-10-CM | POA: Diagnosis not present

## 2022-06-07 DIAGNOSIS — Z1339 Encounter for screening examination for other mental health and behavioral disorders: Secondary | ICD-10-CM | POA: Diagnosis not present

## 2022-06-07 DIAGNOSIS — I10 Essential (primary) hypertension: Secondary | ICD-10-CM | POA: Diagnosis not present

## 2022-06-07 DIAGNOSIS — E7800 Pure hypercholesterolemia, unspecified: Secondary | ICD-10-CM | POA: Diagnosis not present

## 2022-06-10 ENCOUNTER — Other Ambulatory Visit: Payer: Self-pay | Admitting: Internal Medicine

## 2022-06-10 DIAGNOSIS — Z0000 Encounter for general adult medical examination without abnormal findings: Secondary | ICD-10-CM

## 2022-07-20 ENCOUNTER — Ambulatory Visit
Admission: RE | Admit: 2022-07-20 | Discharge: 2022-07-20 | Disposition: A | Discharge status: Home / Self Care | Payer: BC Managed Care – PPO | Source: Ambulatory Visit | Attending: Internal Medicine | Admitting: Internal Medicine

## 2022-07-20 DIAGNOSIS — Z1231 Encounter for screening mammogram for malignant neoplasm of breast: Secondary | ICD-10-CM | POA: Diagnosis not present

## 2022-07-20 DIAGNOSIS — Z0000 Encounter for general adult medical examination without abnormal findings: Secondary | ICD-10-CM

## 2022-11-24 ENCOUNTER — Ambulatory Visit: Payer: BC Managed Care – PPO | Admitting: Orthopaedic Surgery

## 2022-11-25 ENCOUNTER — Other Ambulatory Visit (INDEPENDENT_AMBULATORY_CARE_PROVIDER_SITE_OTHER): Payer: BC Managed Care – PPO

## 2022-11-25 ENCOUNTER — Ambulatory Visit: Payer: BC Managed Care – PPO | Admitting: Orthopaedic Surgery

## 2022-11-25 DIAGNOSIS — G8929 Other chronic pain: Secondary | ICD-10-CM

## 2022-11-25 DIAGNOSIS — M25562 Pain in left knee: Secondary | ICD-10-CM | POA: Diagnosis not present

## 2022-11-25 DIAGNOSIS — M1712 Unilateral primary osteoarthritis, left knee: Secondary | ICD-10-CM | POA: Insufficient documentation

## 2022-11-25 NOTE — Progress Notes (Signed)
The patient is well-known to [place].  We have replaced her right knee back in 2023.  Her left knee has well-documented severe end-stage arthritis.  She is now interested in knee replacement surgery in January which is just 3 months away.  She has been having some weakness with her right knee as she has been compensating with dealing with her left knee pain.  She has tried and failed conservative treatment without left knee for well over a year now.  This includes activity modification, weight loss, injections and anti-inflammatories.  Her right knee slightly hyperextends but as does her left knee but this is her negative motion.  Her left knee has varus malalignment with significant medial joint line tenderness and swelling.  There is pain throughout the arc of motion of that knee and patellofemoral crepitation.  X-rays of the left knee show severe end-stage arthritis with bone-on-bone wear of the medial compartment and patellofemoral joint.  There are large osteophytes in all 3 compartments and significant calcifications around the lateral meniscus.  We talked in length and detail again about knee replacement surgery.  Having had this before her right knee she is fully aware of the risk and benefits of the surgery.  Up until surgery she will continue to work on knee strengthening and quad strengthening exercises.  We will be in touch about scheduling surgery.

## 2023-01-03 ENCOUNTER — Ambulatory Visit: Payer: BC Managed Care – PPO | Admitting: Orthopedic Surgery

## 2023-01-03 DIAGNOSIS — M1812 Unilateral primary osteoarthritis of first carpometacarpal joint, left hand: Secondary | ICD-10-CM

## 2023-01-03 DIAGNOSIS — M79642 Pain in left hand: Secondary | ICD-10-CM | POA: Diagnosis not present

## 2023-01-03 NOTE — Progress Notes (Signed)
Jenafer Donnelly - 62 y.o. female MRN [id number]  Date of birth: 31-May-1960  Office Visit Note: Visit Date: 01/03/2023 PCP: Gaspar Garbe, MD Referred by: Gaspar Garbe, MD  Subjective: No chief complaint on file.  HPI: Gwenette Churchfield is a pleasant 63 y.o. female who presents today for evaluation of ongoing pain at the basilar aspect of the left thumb that is been present now for multiple years.  She has been seen by Dr. Magnus Ivan in the past, who referred her to me for discussion regarding potential [facility] arthroplasty.  She has undergone prior injection to the left thumb CMC articulation in April of this year with minimal relief.  She has not trialed bracing.    She has plans for upcoming total knee arthroplasty performed by Dr. Magnus Ivan in January of this coming year.  Pertinent ROS were reviewed with the patient and found to be negative unless otherwise specified above in HPI.   Visit Reason: left thumb CMC Duration of symptoms: years Hand dominance: right Occupation: Forensic scientist Diabetic: No Smoking: No Heart/Lung History: none Blood Thinners:  none  Prior Testing/EMG: xrays 05/31/22 Injections (Date): April 2024 Treatments: injection Prior Surgery:none  Assessment & Plan: Visit Diagnoses: No diagnosis found.  Plan: Extensive discussion was had with the patient today regarding her ongoing left thumb CMC arthritis.  X-rays were reviewed in detail today which are consistent with her clinical examination, she does have significant degenerative change at the left thumb CMC interval.  We discussed a range of treatment options ranging from conservative to surgical.  From a conservative standpoint, we discussed activity modification, bracing, injections, anti-inflammatory medications as well as topical remedies.  We did discuss that her symptoms have become recalcitrant to the prior injection performed in April of this year.  Given that she has upcoming total knee  arthroplasty surgery in January, I did emphasize that the postoperative recovery period from a thumb CMC arthroplasty would involve 4 weeks of immobilization in a thumb spica brace, during which time we would want her to refrain from any heavy activities.  This would include the use of a walker which is likely in the postoperative period from a total knee arthroplasty.  At this juncture, given that her symptoms are refractory conservative care in the form of activity modification, injections, anti-inflammatory medication, she is indicated for left thumb CMC arthroplasty.  I did explain that I will be willing to try and work with her in terms of timing her surgery out an appropriate juncture in the coming year.  We discussed the surgical technique of the [facility] arthroplasty as well as the postoperative protocol in detail today.  Risks and benefits of the procedure were discussed, risks including but not limited to infection, bleeding, scarring, stiffness, nerve injury, tendon injury, vascular injury, hardware complication if utilized, recurrence of symptoms and need for subsequent operation.  Patient expressed understanding.    She will give some more thought to the potential timing of the left thumb [facility] arthroplasty which is likely to follow the upcoming total knee arthroplasty in January.  She will contact my office moving forward.  For the time being, we will have her fitted for a [employer] Cool brace of the left thumb for symptom relief.  I did also explain that I could potentially try a repeat injection into the thumb [facility] articulation in the near future to give her some relief during her recovery from the total knee arthroplasty.  Greater than 30 minutes was spent  in the care of this patient today.    Follow-up: No follow-ups on file.   Meds & Orders: No orders of the defined types were placed in this encounter.  No orders of the defined types were placed in this encounter.    Procedures: No  procedures performed      Clinical History: No specialty comments available.  She reports that she has never smoked. She does not have any smokeless tobacco history on file. No results for input(s): "HGBA1C", "LABURIC" in the last 8760 hours.  Objective:   Vital Signs: There were no vitals taken for this visit.  Physical Exam  Gen: Well-appearing, in no acute distress; non-toxic CV: Regular Rate. Well-perfused. Warm.  Resp: Breathing unlabored on room air; no wheezing. Psych: Fluid speech in conversation; appropriate affect; normal thought process  Ortho Exam General: Patient is well appearing and in no distress. Cervical spine mobility is full in all directions:   Skin and Muscle: No skin changes are apparent to upper extremities.  Muscle bulk and contour normal, no signs of atrophy.      Range of Motion and Palpation Tests: Mobility is full about the elbows with flexion and extension.  Forearm supination and pronation are 85/85 bilaterally.  Wrist flexion/extension is 75/65 bilaterally.  Digital flexion and extension are full.  Thumb opposition is full to the base of the small fingers bilaterally.     No cords or nodules are palpated.  No triggering is observed.     Significant tenderness over the left thumb CMC articulation is observed, positive grind, positive crepitus.  MP hyperextension minimal, less than 10 degrees.    Finklestein test mildly positive left side   Neurologic, Vascular, Motor: Sensation is intact to light touch in the median/radial/ulnar distributions.  Tinel's testing negative at wrist level. Phalen's negative bilaterally, Derkan's compression bilaterally.  Fingers pink and well perfused.  Capillary refill is brisk.     Imaging: Prior hand x-rays from April of this year were reviewed which do show significant basilar thumb joint arthritis  Past Medical/Family/Surgical/Social History: Medications & Allergies reviewed per EMR, new medications  updated. Patient Active Problem List   Diagnosis Date Noted   Unilateral primary osteoarthritis, left knee 11/25/2022   Status post total right knee replacement 03/27/2021   COVID-19 virus infection 06/09/2019   Acute respiratory failure with hypoxia (HCC) 06/09/2019   Past Medical History:  Diagnosis Date   Arthritis    Hypertension    Family History  Problem Relation Age of Onset   Hypertension Mother    CAD Mother    Hypertension Father    CAD Father    Diabetes Brother    Past Surgical History:  Procedure Laterality Date   ABDOMINAL HYSTERECTOMY     ANKLE SURGERY Left    BACK SURGERY     JOINT REPLACEMENT     Bilateral hip replacements.   SPINAL FUSION     STRABISMUS SURGERY Bilateral    TOTAL HIP ARTHROPLASTY Bilateral    TOTAL KNEE ARTHROPLASTY Right 03/27/2021   Procedure: Right TOTAL KNEE ARTHROPLASTY;  Surgeon: Kathryne Hitch, MD;  Location: [facility];  Service: Orthopedics;  Laterality: Right;   WISDOM TOOTH EXTRACTION     Social History   Occupational History   Not on file  Tobacco Use   Smoking status: Never   Smokeless tobacco: Not on file  Vaping Use   Vaping status: Never Used  Substance and Sexual Activity   Alcohol use: Never   Drug  use: Never   Sexual activity: Not on file    Davaun Quintela Fara Boros) Denese Killings, M.D. [facility] 8:48 PM

## 2023-01-05 ENCOUNTER — Telehealth: Payer: Self-pay | Admitting: Orthopaedic Surgery

## 2023-01-05 NOTE — Telephone Encounter (Signed)
Pt would like to know how far from knee replacement would she be able to get her surgery on her hand with [facility] please advise she is trying to do one long LOA from work instead of 2 separate ones pt cannot take calls at work so LVM unless it is after 3pm

## 2023-01-05 NOTE — Telephone Encounter (Signed)
LMOM for patient of the below message from [place]  

## 2023-01-17 ENCOUNTER — Telehealth: Payer: Self-pay | Admitting: Orthopedic Surgery

## 2023-01-17 NOTE — Telephone Encounter (Signed)
Patient left a message stating she spoke with Dr. Eliberto Ivory office in regards to her knee arthroplasty, and [facility] surgery. Per patient she was told 4 weeks post knee arthroplasty should be enough time for her to be able to proceed with [facility] surgery. Patient is ready to schedule. Please advise.

## 2023-01-19 ENCOUNTER — Telehealth: Payer: Self-pay | Admitting: Orthopedic Surgery

## 2023-01-19 NOTE — Telephone Encounter (Signed)
I do not have anything on this patient. Please call her to advise of Dr. Jamie Kato plan.

## 2023-01-19 NOTE — Telephone Encounter (Signed)
Patient called. Says she has been trying to get in touch with April. She needs to know the date of surgery for her leave of absence. Her call back number is [phone]

## 2023-01-20 ENCOUNTER — Telehealth: Payer: Self-pay | Admitting: Orthopaedic Surgery

## 2023-01-20 NOTE — Telephone Encounter (Signed)
Spoke with patient. Forwarded message to April beavers about getting scheduled

## 2023-01-20 NOTE — Telephone Encounter (Signed)
Patient called and said that can dr. Magnus Ivan and Ans talk about the time of surgery. Her schedule at work is hard because she can't take off but a certain amount of time. [phone]

## 2023-02-07 ENCOUNTER — Telehealth: Payer: Self-pay | Admitting: Orthopaedic Surgery

## 2023-02-07 NOTE — Telephone Encounter (Signed)
 FMLA forms, auth & 25 cash received. To [employer].

## 2023-02-17 NOTE — Progress Notes (Addendum)
COVID Vaccine Completed: yes  Date of COVID positive in last 90 days: no  PCP - Guerry Bruin, MD Cardiologist - n/a  Chest x-ray - n/a EKG - 02/18/23 Epic/chart Stress Test - n/a ECHO - n/a Cardiac Cath - n/a Pacemaker/ICD device last checked: n/a  Spinal Cord Stimulator:n/a  Bowel Prep - no  Sleep Study - n/a CPAP -   Fasting Blood Sugar - n/a Checks Blood Sugar _____ times a day  Last dose of GLP1 agonist-  N/A GLP1 instructions:  Hold 7 days before surgery    Last dose of SGLT-2 inhibitors-  N/A SGLT-2 instructions:  Hold 3 days before surgery    Blood Thinner Instructions: n/a Aspirin Instructions: Last Dose:  Activity level: Can go up a flight of stairs and perform activities of daily living without stopping and without symptoms of chest pain or shortness of breath.  Anesthesia review:   Patient denies shortness of breath, fever, cough and chest pain at PAT appointment  Patient verbalized understanding of instructions that were given to them at the PAT appointment. Patient was also instructed that they will need to review over the PAT instructions again at home before surgery.

## 2023-02-18 ENCOUNTER — Encounter (HOSPITAL_COMMUNITY)
Admission: RE | Admit: 2023-02-18 | Discharge: 2023-02-18 | Disposition: A | Discharge status: Home / Self Care | Payer: BC Managed Care – PPO | Source: Ambulatory Visit | Attending: Orthopaedic Surgery | Admitting: Orthopaedic Surgery

## 2023-02-18 ENCOUNTER — Other Ambulatory Visit: Payer: Self-pay

## 2023-02-18 ENCOUNTER — Encounter (HOSPITAL_COMMUNITY): Payer: Self-pay

## 2023-02-18 VITALS — BP 132/71 | HR 68 | Temp 97.4°F | Resp 16 | Ht 67.0 in | Wt 239.0 lb

## 2023-02-18 DIAGNOSIS — I10 Essential (primary) hypertension: Secondary | ICD-10-CM | POA: Insufficient documentation

## 2023-02-18 DIAGNOSIS — M1712 Unilateral primary osteoarthritis, left knee: Secondary | ICD-10-CM | POA: Diagnosis not present

## 2023-02-18 DIAGNOSIS — Z01818 Encounter for other preprocedural examination: Secondary | ICD-10-CM | POA: Insufficient documentation

## 2023-02-18 LAB — CBC
HCT: 44.8 % (ref 36.0–46.0)
Hemoglobin: 14.2 g/dL (ref 12.0–15.0)
MCH: 29.3 pg (ref 26.0–34.0)
MCHC: 31.7 g/dL (ref 30.0–36.0)
MCV: 92.6 fL (ref 80.0–100.0)
Platelets: 275 10*3/uL (ref 150–400)
RBC: 4.84 MIL/uL (ref 3.87–5.11)
RDW: 13.2 % (ref 11.5–15.5)
WBC: 6.8 10*3/uL (ref 4.0–10.5)
nRBC: 0 % (ref 0.0–0.2)

## 2023-02-18 LAB — BASIC METABOLIC PANEL
Anion gap: 10 (ref 5–15)
BUN: 22 mg/dL (ref 8–23)
CO2: 26 mmol/L (ref 22–32)
Calcium: 9.8 mg/dL (ref 8.9–10.3)
Chloride: 102 mmol/L (ref 98–111)
Creatinine, Ser: 0.6 mg/dL (ref 0.44–1.00)
GFR, Estimated: >60 mL/min (ref >60)
Glucose, Bld: 92 mg/dL (ref 70–99)
Potassium: 4.1 mmol/L (ref 3.5–5.1)
Sodium: 138 mmol/L (ref 135–145)

## 2023-02-18 LAB — SURGICAL PCR SCREEN
MRSA, PCR: NEGATIVE
Staphylococcus aureus: NEGATIVE

## 2023-02-18 NOTE — Patient Instructions (Addendum)
SURGICAL WAITING ROOM VISITATION  Patients having surgery or a procedure may have no more than 2 support people in the waiting area - these visitors may rotate.    Children under the age of 6 must have an adult with them who is not the patient.  Due to an increase in RSV and influenza rates and associated hospitalizations, children ages 35 and under may not visit patients in [facility] hospitals.  Visitors with respiratory illnesses are discouraged from visiting and should remain at home.  If the patient needs to stay at the hospital during part of their recovery, the visitor guidelines for inpatient rooms apply. Pre-op nurse will coordinate an appropriate time for 1 support person to accompany patient in pre-op.  This support person may not rotate.    Please refer to the [facility] website for the visitor guidelines for Inpatients (after your surgery is over and you are in a regular room).    Your procedure is scheduled on: 02/25/23   Report to [facility] Main Entrance    Report to admitting at 5:15 AM   Call this number if you have problems the morning of surgery [phone]   Do not eat food :After Midnight.   After Midnight you may have the following liquids until 4:15 AM DAY OF SURGERY  Water Non-Citrus Juices (without pulp, NO RED-Apple, White grape, White cranberry) Black Coffee (NO MILK/CREAM OR CREAMERS, sugar ok)  Clear Tea (NO MILK/CREAM OR CREAMERS, sugar ok) regular and decaf                             Plain Jell-O (NO RED)                                           Fruit ices (not with fruit pulp, NO RED)                                     Popsicles (NO RED)                                                               Sports drinks like Gatorade (NO RED)                 The day of surgery:  Drink ONE (1) Pre-Surgery Clear Ensure at 4:15 AM the morning of surgery. Drink in one sitting. Do not sip.  This drink was given to you during your hospital   pre-op appointment visit. Nothing else to drink after completing the  Pre-Surgery Clear Ensure.          If you have questions, please contact your surgeon's office.   FOLLOW BOWEL PREP AND ANY ADDITIONAL PRE OP INSTRUCTIONS YOU RECEIVED FROM YOUR SURGEON'S OFFICE!!!     Oral Hygiene is also important to reduce your risk of infection.                                    Remember -  BRUSH YOUR TEETH THE MORNING OF SURGERY WITH YOUR REGULAR TOOTHPASTE  DENTURES WILL BE REMOVED PRIOR TO SURGERY PLEASE DO NOT APPLY "Poly grip" OR ADHESIVES!!!   Stop all vitamins and herbal supplements 7 days before surgery.   Take these medicines the morning of surgery with A SIP OF WATER: Zyrtec                              You may not have any metal on your body including hair pins, jewelry, and body piercing             Do not wear make-up, lotions, powders, perfumes, or deodorant  Do not wear nail polish including gel and S&S, artificial/acrylic nails, or any other type of covering on natural nails including finger and toenails. If you have artificial nails, gel coating, etc. that needs to be removed by a nail salon please have this removed prior to surgery or surgery may need to be canceled/ delayed if the surgeon/ anesthesia feels like they are unable to be safely monitored.   Do not shave  48 hours prior to surgery.    Do not bring valuables to the hospital. [facility] IS NOT             RESPONSIBLE   FOR VALUABLES.   Contacts, glasses, dentures or bridgework may not be worn into surgery.   Bring small overnight bag day of surgery.   DO NOT BRING YOUR HOME MEDICATIONS TO THE HOSPITAL. PHARMACY WILL DISPENSE MEDICATIONS LISTED ON YOUR MEDICATION LIST TO YOU DURING YOUR ADMISSION IN THE HOSPITAL!              Please read over the following fact sheets you were given: IF YOU HAVE QUESTIONS ABOUT YOUR PRE-OP INSTRUCTIONS PLEASE CALL [phone]    If you received a COVID test during  your pre-op visit  it is requested that you wear a mask when out in public, stay away from anyone that may not be feeling well and notify your surgeon if you develop symptoms. If you test positive for Covid or have been in contact with anyone that has tested positive in the last 10 days please notify you surgeon.      Pre-operative 5 CHG Bath Instructions   You can play a key role in reducing the risk of infection after surgery. Your skin needs to be as free of germs as possible. You can reduce the number of germs on your skin by washing with CHG (chlorhexidine gluconate) soap before surgery. CHG is an antiseptic soap that kills germs and continues to kill germs even after washing.   DO NOT use if you have an allergy to chlorhexidine/CHG or antibacterial soaps. If your skin becomes reddened or irritated, stop using the CHG and notify one of our RNs at [phone].   Please shower with the CHG soap starting 4 days before surgery using the following schedule:     Please keep in mind the following:  DO NOT shave, including legs and underarms, starting the day of your first shower.   You may shave your face at any point before/day of surgery.  Place clean sheets on your bed the day you start using CHG soap. Use a clean washcloth (not used since being washed) for each shower. DO NOT sleep with pets once you start using the CHG.   CHG Shower Instructions:  If you choose to wash your hair and  private area, wash first with your normal shampoo/soap.  After you use shampoo/soap, rinse your hair and body thoroughly to remove shampoo/soap residue.  Turn the water OFF and apply about 3 tablespoons (45 ml) of CHG soap to a CLEAN washcloth.  Apply CHG soap ONLY FROM YOUR NECK DOWN TO YOUR TOES (washing for 3-5 minutes)  DO NOT use CHG soap on face, private areas, open wounds, or sores.  Pay special attention to the area where your surgery is being performed.  If you are having back surgery, having  someone wash your back for you may be helpful. Wait 2 minutes after CHG soap is applied, then you may rinse off the CHG soap.  Pat dry with a clean towel  Put on clean clothes/pajamas   If you choose to wear lotion, please use ONLY the CHG-compatible lotions on the back of this paper.     Additional instructions for the day of surgery: DO NOT APPLY any lotions, deodorants, cologne, or perfumes.   Put on clean/comfortable clothes.  Brush your teeth.  Ask your nurse before applying any prescription medications to the skin.      CHG Compatible Lotions   Aveeno Moisturizing lotion  Cetaphil Moisturizing Cream  Cetaphil Moisturizing Lotion  Clairol Herbal Essence Moisturizing Lotion, Dry Skin  Clairol Herbal Essence Moisturizing Lotion, Extra Dry Skin  Clairol Herbal Essence Moisturizing Lotion, Normal Skin  Curel Age Defying Therapeutic Moisturizing Lotion with Alpha Hydroxy  Curel Extreme Care Body Lotion  Curel Soothing Hands Moisturizing Hand Lotion  Curel Therapeutic Moisturizing Cream, Fragrance-Free  Curel Therapeutic Moisturizing Lotion, Fragrance-Free  Curel Therapeutic Moisturizing Lotion, Original Formula  Eucerin Daily Replenishing Lotion  Eucerin Dry Skin Therapy Plus Alpha Hydroxy Crme  Eucerin Dry Skin Therapy Plus Alpha Hydroxy Lotion  Eucerin Original Crme  Eucerin Original Lotion  Eucerin Plus Crme Eucerin Plus Lotion  Eucerin TriLipid Replenishing Lotion  Keri Anti-Bacterial Hand Lotion  Keri Deep Conditioning Original Lotion Dry Skin Formula Softly Scented  Keri Deep Conditioning Original Lotion, Fragrance Free Sensitive Skin Formula  Keri Lotion Fast Absorbing Fragrance Free Sensitive Skin Formula  Keri Lotion Fast Absorbing Softly Scented Dry Skin Formula  Keri Original Lotion  Keri Skin Renewal Lotion Keri Silky Smooth Lotion  Keri Silky Smooth Sensitive Skin Lotion  Nivea Body Creamy Conditioning Oil  Nivea Body Extra Enriched Lotion  Nivea Body  Original Lotion  Nivea Body Sheer Moisturizing Lotion Nivea Crme  Nivea Skin Firming Lotion  NutraDerm 30 Skin Lotion  NutraDerm Skin Lotion  NutraDerm Therapeutic Skin Cream  NutraDerm Therapeutic Skin Lotion  ProShield Protective Hand Cream  Provon moisturizing lotion   Incentive Spirometer  An incentive spirometer is a tool that can help keep your lungs clear and active. This tool measures how well you are filling your lungs with each breath. Taking long deep breaths may help reverse or decrease the chance of developing breathing (pulmonary) problems (especially infection) following: A long period of time when you are unable to move or be active. BEFORE THE PROCEDURE  If the spirometer includes an indicator to show your best effort, your nurse or respiratory therapist will set it to a desired goal. If possible, sit up straight or lean slightly forward. Try not to slouch. Hold the incentive spirometer in an upright position. INSTRUCTIONS FOR USE  Sit on the edge of your bed if possible, or sit up as far as you can in bed or on a chair. Hold the incentive spirometer in an upright position. Breathe  out normally. Place the mouthpiece in your mouth and seal your lips tightly around it. Breathe in slowly and as deeply as possible, raising the piston or the ball toward the top of the column. Hold your breath for 3-5 seconds or for as long as possible. Allow the piston or ball to fall to the bottom of the column. Remove the mouthpiece from your mouth and breathe out normally. Rest for a few seconds and repeat Steps 1 through 7 at least 10 times every 1-2 hours when you are awake. Take your time and take a few normal breaths between deep breaths. The spirometer may include an indicator to show your best effort. Use the indicator as a goal to work toward during each repetition. After each set of 10 deep breaths, practice coughing to be sure your lungs are clear. If you have an incision (the cut  made at the time of surgery), support your incision when coughing by placing a pillow or rolled up towels firmly against it. Once you are able to get out of bed, walk around indoors and cough well. You may stop using the incentive spirometer when instructed by your caregiver.  RISKS AND COMPLICATIONS Take your time so you do not get dizzy or light-headed. If you are in pain, you may need to take or ask for pain medication before doing incentive spirometry. It is harder to take a deep breath if you are having pain. AFTER USE Rest and breathe slowly and easily. It can be helpful to keep track of a log of your progress. Your caregiver can provide you with a simple table to help with this. If you are using the spirometer at home, follow these instructions: SEEK MEDICAL CARE IF:  You are having difficultly using the spirometer. You have trouble using the spirometer as often as instructed. Your pain medication is not giving enough relief while using the spirometer. You develop fever of 100.5 F (38.1 C) or higher. SEEK IMMEDIATE MEDICAL CARE IF:  You cough up bloody sputum that had not been present before. You develop fever of 102 F (38.9 C) or greater. You develop worsening pain at or near the incision site. MAKE SURE YOU:  Understand these instructions. Will watch your condition. Will get help right away if you are not doing well or get worse. Document Released: 05/31/2006 Document Revised: 04/12/2011 Document Reviewed: 08/01/2006 [facility] Patient Information 2014 [place], [place].   ________________________________________________________________________

## 2023-02-24 NOTE — Anesthesia Preprocedure Evaluation (Addendum)
Anesthesia Evaluation  Patient identified by MRN, date of birth, ID band Patient awake    Reviewed: Allergy & Precautions, H&P , NPO status , Patient's Chart, lab work & pertinent test results  Airway Mallampati: III  TM Distance: >3 FB Neck ROM: Full    Dental no notable dental hx. (+) Teeth Intact, Dental Advisory Given   Pulmonary neg pulmonary ROS   Pulmonary exam normal breath sounds clear to auscultation       Cardiovascular hypertension, Pt. on medications  Rhythm:Regular Rate:Normal     Neuro/Psych negative neurological ROS  negative psych ROS   GI/Hepatic negative GI ROS, Neg liver ROS,,,  Endo/Other    Class 3 obesity  Renal/GU negative Renal ROS  negative genitourinary   Musculoskeletal  (+) Arthritis , Osteoarthritis,    Abdominal   Peds  Hematology negative hematology ROS (+)   Anesthesia Other Findings   Reproductive/Obstetrics negative OB ROS                             Anesthesia Physical Anesthesia Plan  ASA: 3  Anesthesia Plan: Spinal   Post-op Pain Management: Regional block* and Tylenol PO (pre-op)*   Induction: Intravenous  PONV Risk Score and Plan: 2 and Ondansetron, Dexamethasone and Propofol infusion  Airway Management Planned: Natural Airway and Simple Face Mask  Additional Equipment:   Intra-op Plan:   Post-operative Plan:   Informed Consent: I have reviewed the patients History and Physical, chart, labs and discussed the procedure including the risks, benefits and alternatives for the proposed anesthesia with the patient or authorized representative who has indicated his/her understanding and acceptance.     Dental advisory given  Plan Discussed with: CRNA  Anesthesia Plan Comments:        Anesthesia Quick Evaluation

## 2023-02-24 NOTE — H&P (Signed)
TOTAL KNEE ADMISSION H&P  Patient is being admitted for left total knee arthroplasty.  Subjective:  Chief Complaint:left knee pain.  HPI: Tracy Orozco, 63 y.o. female, has a history of pain and functional disability in the left knee due to arthritis and has failed non-surgical conservative treatments for greater than 12 weeks to includeNSAID's and/or analgesics, corticosteriod injections, flexibility and strengthening excercises, use of assistive devices, weight reduction as appropriate, and activity modification.  Onset of symptoms was gradual, starting 4 years ago with gradually worsening course since that time. The patient noted no past surgery on the left knee(s).  Patient currently rates pain in the left knee(s) at 10 out of 10 with activity. Patient has night pain, worsening of pain with activity and weight bearing, pain that interferes with activities of daily living, pain with passive range of motion, crepitus, and joint swelling.  Patient has evidence of subchondral sclerosis, periarticular osteophytes, and joint space narrowing by imaging studies. There is no active infection.  Patient Active Problem List   Diagnosis Date Noted   Unilateral primary osteoarthritis, left knee 11/25/2022   Status post total right knee replacement 03/27/2021   COVID-19 virus infection 06/09/2019   Acute respiratory failure with hypoxia (HCC) 06/09/2019   Past Medical History:  Diagnosis Date   Arthritis    Hypertension     Past Surgical History:  Procedure Laterality Date   ABDOMINAL HYSTERECTOMY     ANKLE SURGERY Left    BACK SURGERY     lumbar fusuion   JOINT REPLACEMENT     Bilateral hip replacements.   SPINAL FUSION     STRABISMUS SURGERY Bilateral    TOTAL HIP ARTHROPLASTY Bilateral    TOTAL KNEE ARTHROPLASTY Right 03/27/2021   Procedure: Right TOTAL KNEE ARTHROPLASTY;  Surgeon: Kathryne Hitch, MD;  Location: [facility] ORS;  Service: Orthopedics;  Laterality: Right;   WISDOM TOOTH  EXTRACTION      No current facility-administered medications for this encounter.   Current Outpatient Medications  Medication Sig Dispense Refill Last Dose/Taking   ascorbic acid (VITAMIN C) 500 MG tablet Take 1 tablet (500 mg total) by mouth daily. 14 tablet 0 Taking   calcium gluconate 500 MG tablet Take 1 tablet by mouth daily. Vit d 600 mg /20 mcg   Taking   cetirizine (ZYRTEC) 10 MG tablet Take 10 mg by mouth daily.   Taking   GLUCOSAMINE-CHONDROITIN PO Take 2 tablets by mouth daily. 1500/1200   Taking   ibuprofen (ADVIL) 200 MG tablet Take 800 mg by mouth daily.   Taking   Magnesium 250 MG TABS Take 250 mg by mouth daily.   Taking   Multiple Vitamin (MULTIVITAMIN WITH MINERALS) TABS tablet Take 1 tablet by mouth daily. Woman 50+   Taking   valsartan (DIOVAN) 160 MG tablet Take 160 mg by mouth daily.   Taking   VITAMIN D PO Take 5,000 Units by mouth daily.   Taking   zinc sulfate 220 (50 Zn) MG capsule Take 1 capsule (220 mg total) by mouth daily. 14 capsule 0 Taking   Allergies  Allergen Reactions   Pollen Extract     Other Reaction(s): Unknown   Morphine And Codeine Palpitations and Other (See Comments)    "Heart pounds".    Social History   Tobacco Use   Smoking status: Never   Smokeless tobacco: Not on file  Substance Use Topics   Alcohol use: Never    Family History  Problem Relation Age of Onset  Hypertension Mother    CAD Mother    Hypertension Father    CAD Father    Diabetes Brother      Review of Systems  Objective:  Physical Exam Vitals reviewed.  Constitutional:      Appearance: Normal appearance. She is obese.  HENT:     Head: Normocephalic and atraumatic.  Eyes:     Extraocular Movements: Extraocular movements intact.     Pupils: Pupils are equal, round, and reactive to light.  Cardiovascular:     Rate and Rhythm: Normal rate and regular rhythm.     Pulses: Normal pulses.  Pulmonary:     Effort: Pulmonary effort is normal.     Breath  sounds: Normal breath sounds.  Abdominal:     Palpations: Abdomen is soft.  Musculoskeletal:     Cervical back: Normal range of motion and neck supple.     Left knee: Effusion, bony tenderness and crepitus present. Decreased range of motion. Tenderness present over the medial joint line and lateral joint line. Abnormal alignment.  Neurological:     Mental Status: She is alert and oriented to person, place, and time.  Psychiatric:        Behavior: Behavior normal.     Vital signs in last 24 hours:    Labs:   Estimated body mass index is 37.43 kg/m as calculated from the following:   Height as of 02/18/23: 5\' 7"  (1.702 m).   Weight as of 02/18/23: 108.4 kg.   Imaging Review Plain radiographs demonstrate severe degenerative joint disease of the left knee(s). The overall alignment ismild varus. The bone quality appears to be good for age and reported activity level.      Assessment/Plan:  End stage arthritis, left knee   The patient history, physical examination, clinical judgment of the provider and imaging studies are consistent with end stage degenerative joint disease of the left knee(s) and total knee arthroplasty is deemed medically necessary. The treatment options including medical management, injection therapy arthroscopy and arthroplasty were discussed at length. The risks and benefits of total knee arthroplasty were presented and reviewed. The risks due to aseptic loosening, infection, stiffness, patella tracking problems, thromboembolic complications and other imponderables were discussed. The patient acknowledged the explanation, agreed to proceed with the plan and consent was signed. Patient is being admitted for inpatient treatment for surgery, pain control, PT, OT, prophylactic antibiotics, VTE prophylaxis, progressive ambulation and ADL's and discharge planning. The patient is planning to be discharged home with home health services

## 2023-02-25 ENCOUNTER — Other Ambulatory Visit: Payer: Self-pay

## 2023-02-25 ENCOUNTER — Ambulatory Visit (HOSPITAL_COMMUNITY): Payer: BC Managed Care – PPO | Admitting: Anesthesiology

## 2023-02-25 ENCOUNTER — Encounter (HOSPITAL_COMMUNITY): Payer: Self-pay | Admitting: Orthopaedic Surgery

## 2023-02-25 ENCOUNTER — Observation Stay (HOSPITAL_COMMUNITY): Payer: BC Managed Care – PPO

## 2023-02-25 ENCOUNTER — Observation Stay (HOSPITAL_COMMUNITY)
Admission: RE | Admit: 2023-02-25 | Discharge: 2023-02-26 | Disposition: A | Discharge status: Home Health | Payer: BC Managed Care – PPO | Source: Ambulatory Visit | Attending: Orthopaedic Surgery | Admitting: Orthopaedic Surgery

## 2023-02-25 ENCOUNTER — Encounter (HOSPITAL_COMMUNITY)
Admission: RE | Disposition: A | Discharge status: Home Health | Payer: Self-pay | Source: Ambulatory Visit | Attending: Orthopaedic Surgery

## 2023-02-25 DIAGNOSIS — I10 Essential (primary) hypertension: Secondary | ICD-10-CM | POA: Insufficient documentation

## 2023-02-25 DIAGNOSIS — Z471 Aftercare following joint replacement surgery: Secondary | ICD-10-CM | POA: Diagnosis not present

## 2023-02-25 DIAGNOSIS — Z96643 Presence of artificial hip joint, bilateral: Secondary | ICD-10-CM | POA: Insufficient documentation

## 2023-02-25 DIAGNOSIS — Z79899 Other long term (current) drug therapy: Secondary | ICD-10-CM | POA: Diagnosis not present

## 2023-02-25 DIAGNOSIS — M1712 Unilateral primary osteoarthritis, left knee: Principal | ICD-10-CM | POA: Insufficient documentation

## 2023-02-25 DIAGNOSIS — G8918 Other acute postprocedural pain: Secondary | ICD-10-CM | POA: Diagnosis not present

## 2023-02-25 DIAGNOSIS — Z96652 Presence of left artificial knee joint: Secondary | ICD-10-CM | POA: Diagnosis not present

## 2023-02-25 DIAGNOSIS — Z8616 Personal history of COVID-19: Secondary | ICD-10-CM | POA: Diagnosis not present

## 2023-02-25 DIAGNOSIS — Z96651 Presence of right artificial knee joint: Secondary | ICD-10-CM | POA: Insufficient documentation

## 2023-02-25 DIAGNOSIS — R609 Edema, unspecified: Secondary | ICD-10-CM | POA: Diagnosis not present

## 2023-02-25 HISTORY — PX: TOTAL KNEE ARTHROPLASTY: SHX125

## 2023-02-25 LAB — CBC
HCT: 41.2 % (ref 36.0–46.0)
Hemoglobin: 13.3 g/dL (ref 12.0–15.0)
MCH: 29.8 pg (ref 26.0–34.0)
MCHC: 32.3 g/dL (ref 30.0–36.0)
MCV: 92.2 fL (ref 80.0–100.0)
Platelets: 240 10*3/uL (ref 150–400)
RBC: 4.47 MIL/uL (ref 3.87–5.11)
RDW: 13.3 % (ref 11.5–15.5)
WBC: 7.4 10*3/uL (ref 4.0–10.5)
nRBC: 0 % (ref 0.0–0.2)

## 2023-02-25 LAB — BASIC METABOLIC PANEL
Anion gap: 11 (ref 5–15)
BUN: 16 mg/dL (ref 8–23)
CO2: 25 mmol/L (ref 22–32)
Calcium: 9.4 mg/dL (ref 8.9–10.3)
Chloride: 106 mmol/L (ref 98–111)
Creatinine, Ser: 0.6 mg/dL (ref 0.44–1.00)
GFR, Estimated: >60 mL/min (ref >60)
Glucose, Bld: 118 mg/dL — ABNORMAL HIGH (ref 70–99)
Potassium: 3.9 mmol/L (ref 3.5–5.1)
Sodium: 142 mmol/L (ref 135–145)

## 2023-02-25 SURGERY — ARTHROPLASTY, KNEE, TOTAL
Anesthesia: Spinal | Site: Knee | Laterality: Left

## 2023-02-25 MED ORDER — POLYETHYLENE GLYCOL 3350 17 G PO PACK: 17.0000 g | PACK | Freq: Every day | ORAL | Status: DC | PRN

## 2023-02-25 MED ORDER — HYDROMORPHONE HCL 1 MG/ML IJ SOLN
0.5000 mg | INTRAMUSCULAR | Status: DC | PRN
  Administered 2023-02-25: 1 mg via INTRAVENOUS
  Filled 2023-02-25: qty 1

## 2023-02-25 MED ORDER — METHOCARBAMOL 500 MG PO TABS
500.0000 mg | ORAL_TABLET | Freq: Four times a day (QID) | ORAL | Status: DC | PRN
  Administered 2023-02-25 – 2023-02-26 (×4): 500 mg via ORAL
  Filled 2023-02-25 (×4): qty 1

## 2023-02-25 MED ORDER — CEFAZOLIN SODIUM-DEXTROSE 2-4 GM/100ML-% IV SOLN
2.0000 g | INTRAVENOUS | Status: AC
  Administered 2023-02-25: 2 g via INTRAVENOUS
  Filled 2023-02-25: qty 100

## 2023-02-25 MED ORDER — CHLORHEXIDINE GLUCONATE 0.12 % MT SOLN
15.0000 mL | Freq: Once | OROMUCOSAL | Status: AC
  Administered 2023-02-25: 15 mL via OROMUCOSAL

## 2023-02-25 MED ORDER — BUPIVACAINE-EPINEPHRINE 0.25% -1:200000 IJ SOLN
INTRAMUSCULAR | Status: AC
  Filled 2023-02-25: qty 1

## 2023-02-25 MED ORDER — PROPOFOL 500 MG/50ML IV EMUL
INTRAVENOUS | Status: DC | PRN
  Administered 2023-02-25: 100 ug/kg/min via INTRAVENOUS

## 2023-02-25 MED ORDER — METOCLOPRAMIDE HCL 5 MG/ML IJ SOLN: 5.0000 mg | Freq: Three times a day (TID) | INTRAMUSCULAR | Status: DC | PRN

## 2023-02-25 MED ORDER — LACTATED RINGERS IV SOLN
INTRAVENOUS | Status: DC
Start: 2023-02-25 — End: 2023-02-25

## 2023-02-25 MED ORDER — PANTOPRAZOLE SODIUM 40 MG PO TBEC
40.0000 mg | DELAYED_RELEASE_TABLET | Freq: Every day | ORAL | Status: DC
  Administered 2023-02-25 – 2023-02-26 (×2): 40 mg via ORAL
  Filled 2023-02-25 (×2): qty 1

## 2023-02-25 MED ORDER — BUPIVACAINE-EPINEPHRINE (PF) 0.5% -1:200000 IJ SOLN
INTRAMUSCULAR | Status: DC | PRN
  Administered 2023-02-25: 20 mL via PERINEURAL

## 2023-02-25 MED ORDER — EPHEDRINE SULFATE-NACL 50-0.9 MG/10ML-% IV SOSY
PREFILLED_SYRINGE | INTRAVENOUS | Status: DC | PRN
  Administered 2023-02-25 (×2): 5 mg via INTRAVENOUS

## 2023-02-25 MED ORDER — IRBESARTAN 150 MG PO TABS
150.0000 mg | ORAL_TABLET | Freq: Every day | ORAL | Status: DC
  Administered 2023-02-25: 150 mg via ORAL
  Filled 2023-02-25: qty 1

## 2023-02-25 MED ORDER — PHENYLEPHRINE HCL (PRESSORS) 10 MG/ML IV SOLN
INTRAVENOUS | Status: DC | PRN
  Administered 2023-02-25: 120 ug via INTRAVENOUS
  Administered 2023-02-25: 80 ug via INTRAVENOUS

## 2023-02-25 MED ORDER — ACETAMINOPHEN 325 MG PO TABS
325.0000 mg | ORAL_TABLET | Freq: Four times a day (QID) | ORAL | Status: DC | PRN
Start: 2023-02-25 — End: 2023-02-26
  Administered 2023-02-25: 325 mg via ORAL
  Administered 2023-02-26: 650 mg via ORAL
  Filled 2023-02-25 (×2): qty 2

## 2023-02-25 MED ORDER — ZINC SULFATE 220 (50 ZN) MG PO CAPS
220.0000 mg | ORAL_CAPSULE | Freq: Every day | ORAL | Status: DC
  Administered 2023-02-26: 220 mg via ORAL
  Filled 2023-02-25 (×2): qty 1

## 2023-02-25 MED ORDER — DEXAMETHASONE SODIUM PHOSPHATE 10 MG/ML IJ SOLN
INTRAMUSCULAR | Status: DC | PRN
  Administered 2023-02-25: 8 mg via INTRAVENOUS

## 2023-02-25 MED ORDER — DOCUSATE SODIUM 100 MG PO CAPS
100.0000 mg | ORAL_CAPSULE | Freq: Two times a day (BID) | ORAL | Status: DC
  Administered 2023-02-25 – 2023-02-26 (×3): 100 mg via ORAL
  Filled 2023-02-25 (×3): qty 1

## 2023-02-25 MED ORDER — FENTANYL CITRATE (PF) 100 MCG/2ML IJ SOLN
INTRAMUSCULAR | Status: AC
  Filled 2023-02-25: qty 2

## 2023-02-25 MED ORDER — ALUM & MAG HYDROXIDE-SIMETH 200-200-20 MG/5ML PO SUSP: 30.0000 mL | ORAL | Status: DC | PRN

## 2023-02-25 MED ORDER — ASPIRIN 81 MG PO CHEW
81.0000 mg | CHEWABLE_TABLET | Freq: Two times a day (BID) | ORAL | Status: DC
  Administered 2023-02-25 – 2023-02-26 (×2): 81 mg via ORAL
  Filled 2023-02-25 (×2): qty 1

## 2023-02-25 MED ORDER — 0.9 % SODIUM CHLORIDE (POUR BTL) OPTIME
TOPICAL | Status: DC | PRN
  Administered 2023-02-25: 1000 mL

## 2023-02-25 MED ORDER — ONDANSETRON HCL 4 MG PO TABS
4.0000 mg | ORAL_TABLET | Freq: Four times a day (QID) | ORAL | Status: DC | PRN
Start: 2023-02-25 — End: 2023-02-26

## 2023-02-25 MED ORDER — METHOCARBAMOL 1000 MG/10ML IJ SOLN
500.0000 mg | Freq: Four times a day (QID) | INTRAMUSCULAR | Status: DC | PRN
Start: 2023-02-25 — End: 2023-02-26

## 2023-02-25 MED ORDER — MIDAZOLAM HCL 2 MG/2ML IJ SOLN
INTRAMUSCULAR | Status: AC
  Filled 2023-02-25: qty 2

## 2023-02-25 MED ORDER — ONDANSETRON HCL 4 MG/2ML IJ SOLN: 4.0000 mg | Freq: Four times a day (QID) | INTRAMUSCULAR | Status: DC | PRN

## 2023-02-25 MED ORDER — HYDROMORPHONE HCL 1 MG/ML IJ SOLN
0.2500 mg | INTRAMUSCULAR | Status: DC | PRN
  Administered 2023-02-25: 0.25 mg via INTRAVENOUS

## 2023-02-25 MED ORDER — PHENYLEPHRINE HCL-NACL 20-0.9 MG/250ML-% IV SOLN
INTRAVENOUS | Status: AC
  Filled 2023-02-25: qty 250

## 2023-02-25 MED ORDER — PHENYLEPHRINE HCL-NACL 20-0.9 MG/250ML-% IV SOLN
INTRAVENOUS | Status: DC | PRN
  Administered 2023-02-25: 20 ug/min via INTRAVENOUS

## 2023-02-25 MED ORDER — HYDROMORPHONE HCL 1 MG/ML IJ SOLN
INTRAMUSCULAR | Status: AC
  Administered 2023-02-25: 0.25 mg via INTRAVENOUS
  Filled 2023-02-25: qty 1

## 2023-02-25 MED ORDER — MAGNESIUM OXIDE -MG SUPPLEMENT 400 (240 MG) MG PO TABS
200.0000 mg | ORAL_TABLET | Freq: Every day | ORAL | Status: DC
Start: 2023-02-26 — End: 2023-02-26
  Administered 2023-02-26: 200 mg via ORAL
  Filled 2023-02-25 (×2): qty 1

## 2023-02-25 MED ORDER — TRANEXAMIC ACID-NACL 1000-0.7 MG/100ML-% IV SOLN
1000.0000 mg | INTRAVENOUS | Status: AC
  Administered 2023-02-25: 1000 mg via INTRAVENOUS
  Filled 2023-02-25: qty 100

## 2023-02-25 MED ORDER — OXYCODONE HCL 5 MG PO TABS
5.0000 mg | ORAL_TABLET | ORAL | Status: DC | PRN
Start: 2023-02-25 — End: 2023-02-26
  Administered 2023-02-25: 5 mg via ORAL
  Administered 2023-02-26: 10 mg via ORAL
  Filled 2023-02-25: qty 1
  Filled 2023-02-25 (×2): qty 2
  Filled 2023-02-25: qty 1

## 2023-02-25 MED ORDER — FENTANYL CITRATE (PF) 100 MCG/2ML IJ SOLN
INTRAMUSCULAR | Status: DC | PRN
  Administered 2023-02-25: 50 ug via INTRAVENOUS
  Administered 2023-02-25 (×2): 25 ug via INTRAVENOUS

## 2023-02-25 MED ORDER — DIPHENHYDRAMINE HCL 12.5 MG/5ML PO ELIX
12.5000 mg | ORAL_SOLUTION | ORAL | Status: DC | PRN
Start: 2023-02-25 — End: 2023-02-26

## 2023-02-25 MED ORDER — PHENOL 1.4 % MT LIQD: 1.0000 | OROMUCOSAL | Status: DC | PRN

## 2023-02-25 MED ORDER — MENTHOL 3 MG MT LOZG: 1.0000 | LOZENGE | OROMUCOSAL | Status: DC | PRN

## 2023-02-25 MED ORDER — OXYCODONE HCL 5 MG PO TABS
10.0000 mg | ORAL_TABLET | ORAL | Status: DC | PRN
  Administered 2023-02-25: 10 mg via ORAL
  Administered 2023-02-26 (×2): 15 mg via ORAL
  Filled 2023-02-25: qty 3
  Filled 2023-02-25: qty 2

## 2023-02-25 MED ORDER — BUPIVACAINE-EPINEPHRINE 0.25% -1:200000 IJ SOLN
INTRAMUSCULAR | Status: DC | PRN
  Administered 2023-02-25: 30 mL

## 2023-02-25 MED ORDER — METOCLOPRAMIDE HCL 5 MG PO TABS
5.0000 mg | ORAL_TABLET | Freq: Three times a day (TID) | ORAL | Status: DC | PRN
Start: 2023-02-25 — End: 2023-02-26

## 2023-02-25 MED ORDER — BUPIVACAINE IN DEXTROSE 0.75-8.25 % IT SOLN
INTRATHECAL | Status: DC | PRN
Start: 2023-02-25 — End: 2023-02-25
  Administered 2023-02-25: 1.6 mL via INTRATHECAL

## 2023-02-25 MED ORDER — ONDANSETRON HCL 4 MG/2ML IJ SOLN
INTRAMUSCULAR | Status: DC | PRN
  Administered 2023-02-25: 4 mg via INTRAVENOUS

## 2023-02-25 MED ORDER — ACETAMINOPHEN 500 MG PO TABS
1000.0000 mg | ORAL_TABLET | Freq: Once | ORAL | Status: AC
  Administered 2023-02-25: 1000 mg via ORAL
  Filled 2023-02-25: qty 2

## 2023-02-25 MED ORDER — SODIUM CHLORIDE 0.9 % IV SOLN: INTRAVENOUS | Status: AC

## 2023-02-25 MED ORDER — CEFAZOLIN SODIUM-DEXTROSE 2-4 GM/100ML-% IV SOLN
2.0000 g | Freq: Four times a day (QID) | INTRAVENOUS | Status: AC
  Administered 2023-02-25 (×2): 2 g via INTRAVENOUS
  Filled 2023-02-25 (×2): qty 100

## 2023-02-25 MED ORDER — MIDAZOLAM HCL 5 MG/5ML IJ SOLN
INTRAMUSCULAR | Status: DC | PRN
  Administered 2023-02-25 (×2): 1 mg via INTRAVENOUS

## 2023-02-25 MED ORDER — POVIDONE-IODINE 10 % EX SWAB: 2.0000 | Freq: Once | CUTANEOUS | Status: DC

## 2023-02-25 MED ORDER — VITAMIN C 500 MG PO TABS
500.0000 mg | ORAL_TABLET | Freq: Every day | ORAL | Status: DC
  Administered 2023-02-26: 500 mg via ORAL
  Filled 2023-02-25 (×2): qty 1

## 2023-02-25 MED ORDER — ORAL CARE MOUTH RINSE
15.0000 mL | Freq: Once | OROMUCOSAL | Status: AC
Start: 2023-02-25 — End: 2023-02-25

## 2023-02-25 MED ORDER — SODIUM CHLORIDE 0.9 % IR SOLN
Status: DC | PRN
  Administered 2023-02-25: 1000 mL

## 2023-02-25 MED ORDER — STERILE WATER FOR IRRIGATION IR SOLN
Status: DC | PRN
  Administered 2023-02-25: 1000 mL

## 2023-02-25 SURGICAL SUPPLY — 54 items
BAG COUNTER SPONGE SURGICOUNT (BAG) IMPLANT
BAG ZIPLOCK 12X15 (MISCELLANEOUS) ×1 IMPLANT
BENZOIN TINCTURE PRP APPL 2/3 (GAUZE/BANDAGES/DRESSINGS) IMPLANT
BIT DRILL QUICK REL 1/8 2PK SL (BIT) IMPLANT
BLADE SAG 18X100X1.27 (BLADE) ×1 IMPLANT
BLADE SURG SZ10 CARB STEEL (BLADE) ×2 IMPLANT
BNDG ELASTIC 6INX 5YD STR LF (GAUZE/BANDAGES/DRESSINGS) ×2 IMPLANT
BNDG ELASTIC 6X10 VLCR STRL LF (GAUZE/BANDAGES/DRESSINGS) IMPLANT
BOWL SMART MIX CTS (DISPOSABLE) IMPLANT
CEMENT BONE R 1X40 (Cement) IMPLANT
COMP FEM KNEE STD PS 9 LT (Joint) ×1 IMPLANT
COMP PATELLA PEG 3 32 (Joint) ×1 IMPLANT
COMPONENT FEM KNEE STD PS 9 LT (Joint) IMPLANT
COMPONENT PATELLA PEG 3 32 (Joint) IMPLANT
COOLER ICEMAN CLASSIC (MISCELLANEOUS) ×1 IMPLANT
COVER SURGICAL LIGHT HANDLE (MISCELLANEOUS) ×1 IMPLANT
CUFF TRNQT CYL 34X4.125X (TOURNIQUET CUFF) ×1 IMPLANT
DRAPE INCISE IOBAN 66X45 STRL (DRAPES) ×1 IMPLANT
DRAPE U-SHAPE 47X51 STRL (DRAPES) ×1 IMPLANT
DRSG XEROFORM 1X8 (GAUZE/BANDAGES/DRESSINGS) IMPLANT
DURAPREP 26ML APPLICATOR (WOUND CARE) ×1 IMPLANT
ELECT BLADE TIP CTD 4 INCH (ELECTRODE) ×1 IMPLANT
ELECT REM PT RETURN 15FT ADLT (MISCELLANEOUS) ×1 IMPLANT
GAUZE PAD ABD 8X10 STRL (GAUZE/BANDAGES/DRESSINGS) ×2 IMPLANT
GAUZE SPONGE 4X4 12PLY STRL (GAUZE/BANDAGES/DRESSINGS) ×1 IMPLANT
GAUZE XEROFORM 1X8 LF (GAUZE/BANDAGES/DRESSINGS) IMPLANT
GLOVE BIO SURGEON STRL SZ7.5 (GLOVE) ×1 IMPLANT
GLOVE BIOGEL PI IND STRL 8 (GLOVE) ×2 IMPLANT
GLOVE ECLIPSE 8.0 STRL XLNG CF (GLOVE) ×1 IMPLANT
GOWN STRL REUS W/ TWL XL LVL3 (GOWN DISPOSABLE) ×2 IMPLANT
HOLDER FOLEY CATH W/STRAP (MISCELLANEOUS) IMPLANT
IMMOBILIZER KNEE 20 (SOFTGOODS) ×1 IMPLANT
IMMOBILIZER KNEE 20 THIGH 36 (SOFTGOODS) ×1 IMPLANT
KIT TURNOVER KIT A (KITS) IMPLANT
NS IRRIG 1000ML POUR BTL (IV SOLUTION) ×1 IMPLANT
PACK TOTAL KNEE CUSTOM (KITS) ×1 IMPLANT
PAD COLD SHLDR WRAP-ON (PAD) ×1 IMPLANT
PADDING CAST COTTON 6X4 STRL (CAST SUPPLIES) ×2 IMPLANT
PROTECTOR NERVE ULNAR (MISCELLANEOUS) ×1 IMPLANT
SCREW FEMALE HEX FIX 25X2.5 (ORTHOPEDIC DISPOSABLE SUPPLIES) IMPLANT
SET HNDPC FAN SPRY TIP SCT (DISPOSABLE) ×1 IMPLANT
SET PAD KNEE POSITIONER (MISCELLANEOUS) ×1 IMPLANT
SPIKE FLUID TRANSFER (MISCELLANEOUS) IMPLANT
STAPLER SKIN PROX WIDE 3.9 (STAPLE) IMPLANT
STEM ARTISURF EF 12 SZ8-11 (Stem) IMPLANT
STEM TIB PERS SZ E 5D LT (Screw) IMPLANT
STRIP CLOSURE SKIN 1/2X4 (GAUZE/BANDAGES/DRESSINGS) IMPLANT
SUT MNCRL AB 4-0 PS2 18 (SUTURE) IMPLANT
SUT VIC AB 0 CT1 27XBRD ANTBC (SUTURE) ×1 IMPLANT
SUT VIC AB 1 CT1 36 (SUTURE) ×2 IMPLANT
SUT VIC AB 2-0 CT1 TAPERPNT 27 (SUTURE) ×2 IMPLANT
TRAY FOLEY MTR SLVR 16FR STAT (SET/KITS/TRAYS/PACK) IMPLANT
WATER STERILE IRR 1000ML POUR (IV SOLUTION) ×2 IMPLANT
YANKAUER SUCT BULB TIP NO VENT (SUCTIONS) IMPLANT

## 2023-02-25 NOTE — Transfer of Care (Signed)
Immediate Anesthesia Transfer of Care Note  Patient: Tracy Orozco  Procedure(s) Performed: LEFT TOTAL KNEE ARTHROPLASTY (Left: Knee)  Patient Location: PACU  Anesthesia Type:MAC  Level of Consciousness: awake and alert   Airway & Oxygen Therapy: Patient Spontanous Breathing  Post-op Assessment: Report given to RN  Post vital signs: Reviewed and stable  Last Vitals:  Vitals Value Taken Time  BP 109/60 02/25/23 0906  Temp    Pulse 87 02/25/23 0910  Resp 20 02/25/23 0910  SpO2 96 % 02/25/23 0910  Vitals shown include unfiled device data.  Last Pain:  Vitals:   02/25/23 0603  TempSrc: Oral  PainSc:          Complications: No notable events documented.

## 2023-02-25 NOTE — Evaluation (Signed)
Physical Therapy Evaluation Patient Details Name: Tracy Orozco MRN: [id number] DOB: 08-27-1960 Today's Date: 02/25/2023  History of Present Illness  Pt is a 63 year old female s/p L TKA on 02/25/23.  PMHx: R TKA, bil THAs, spinal fusion L5-S1  Clinical Impression  Pt is s/p TKA resulting in the deficits listed below (see PT Problem List).  Pt will benefit from acute skilled PT to increase their independence and safety with mobility to allow discharge.   Pt assisted with mobilizing POD #0 and able to ambulate approx 60 ft in hallway with RW.  Pt denies dizziness today (reports hx of post op hypotension).  Pt anticipates discharging to her sister's home (for assist as needed and less stairs).  Pt encouraged to perform ankle pumps and use incentive spirometer while in recliner end of session.         If plan is discharge home, recommend the following: Help with stairs or ramp for entrance   Can travel by private vehicle        Equipment Recommendations None recommended by PT  Recommendations for Other Services       Functional Status Assessment Patient has had a recent decline in their functional status and demonstrates the ability to make significant improvements in function in a reasonable and predictable amount of time.     Precautions / Restrictions Precautions Precautions: Fall;Knee Precaution Comments: able to perform SLR Restrictions Weight Bearing Restrictions Per Provider Order: No Other Position/Activity Restrictions: WBAT      Mobility  Bed Mobility Overal bed mobility: Needs Assistance Bed Mobility: Supine to Sit     Supine to sit: Supervision, HOB elevated     General bed mobility comments: verbal cues for technique and self assist    Transfers Overall transfer level: Needs assistance Equipment used: Rolling walker (2 wheels) Transfers: Sit to/from Stand Sit to Stand: Contact guard assist           General transfer comment: verbal cues for UE and LE  positioning for pain control    Ambulation/Gait Ambulation/Gait assistance: Contact guard assist Gait Distance (Feet): 60 Feet Assistive device: Rolling walker (2 wheels) Gait Pattern/deviations: Step-to pattern, Decreased stance time - left, Antalgic Gait velocity: decr     General Gait Details: verbal cues for sequence, RW positioning, step length, allowing knee flexion as tolerated (don't keep leg stiff)  Stairs            Wheelchair Mobility     Tilt Bed    Modified Rankin (Stroke Patients Only)       Balance                                             Pertinent Vitals/Pain Pain Assessment Pain Assessment: 0-10 Pain Score: 6  Pain Location: left knee Pain Descriptors / Indicators: Sore, Aching Pain Intervention(s): RN gave pain meds during session, Repositioned, Monitored during session, Ice applied    Home Living Family/patient expects to be discharged to:: Private residence Living Arrangements: Alone Available Help at Discharge: Family;Available 24 hours/day Type of Home: House Home Access: Stairs to enter Entrance Stairs-Rails: None Entrance Stairs-Number of Steps: 2   Home Layout: One level Home Equipment: Agricultural consultant (2 wheels);Tub bench Additional Comments: plans to d/c to sister's house as above    Prior Function Prior Level of Function : Independent/Modified Independent  Extremity/Trunk Assessment        Lower Extremity Assessment Lower Extremity Assessment: LLE deficits/detail LLE Deficits / Details: able to perform SLR, observed at least 50* knee flexion at EOB, able to perform bil ankle pumps       Communication   Communication Communication: No apparent difficulties  Cognition Arousal: Alert Behavior During Therapy: WFL for tasks assessed/performed Overall Cognitive Status: Within Functional Limits for tasks assessed                                           General Comments      Exercises     Assessment/Plan    PT Assessment Patient needs continued PT services  PT Problem List Decreased strength;Decreased range of motion;Decreased balance;Decreased mobility;Pain;Decreased knowledge of use of DME       PT Treatment Interventions Gait training;DME instruction;Functional mobility training;Therapeutic exercise;Patient/family education;Stair training;Balance training;Therapeutic activities    PT Goals (Current goals can be found in the Care Plan section)  Acute Rehab PT Goals PT Goal Formulation: With patient Time For Goal Achievement: 03/04/23 Potential to Achieve Goals: Good    Frequency 7X/week     Co-evaluation               AM-PAC PT "6 Clicks" Mobility  Outcome Measure Help needed turning from your back to your side while in a flat bed without using bedrails?: A Little Help needed moving from lying on your back to sitting on the side of a flat bed without using bedrails?: A Little Help needed moving to and from a bed to a chair (including a wheelchair)?: A Little Help needed standing up from a chair using your arms (e.g., wheelchair or bedside chair)?: A Little Help needed to walk in hospital room?: A Little Help needed climbing 3-5 steps with a railing? : A Lot 6 Click Score: 17    End of Session Equipment Utilized During Treatment: Gait belt Activity Tolerance: Patient tolerated treatment well Patient left: in chair;with call bell/phone within reach;with chair alarm set Nurse Communication: Mobility status PT Visit Diagnosis: Difficulty in walking, not elsewhere classified (R26.2)    Time: 6295-2841 PT Time Calculation (min) (ACUTE ONLY): 25 min   Charges:   PT Evaluation $PT Eval Low Complexity: 1 Low PT Treatments $Gait Training: 8-22 mins PT General Charges $$ ACUTE PT VISIT: 1 Visit       Paulino Door, DPT Physical Therapist Acute Rehabilitation Services Office: [phone]   Janan Halter  [place] 02/25/2023, 4:27 PM

## 2023-02-25 NOTE — Anesthesia Procedure Notes (Signed)
Spinal  Patient location during procedure: OR Start time: 02/25/2023 7:20 AM End time: 02/25/2023 7:28 AM Reason for block: surgical anesthesia Staffing Performed: anesthesiologist  Anesthesiologist: Gaynelle Adu, MD Performed by: Gaynelle Adu, MD Authorized by: Gaynelle Adu, MD   Preanesthetic Checklist Completed: patient identified, IV checked, risks and benefits discussed, surgical consent, monitors and equipment checked, pre-op evaluation and timeout performed Spinal Block Patient position: sitting Prep: DuraPrep Patient monitoring: cardiac monitor, continuous pulse ox and blood pressure Approach: midline Location: L3-4 Injection technique: single-shot Needle Needle type: Quincke (Attempted Pencan 24G. Needle bent.)  Needle gauge: 22 G Needle length: 9 cm Assessment Sensory level: T8 Events: CSF return Additional Notes Functioning IV was confirmed and monitors were applied. Sterile prep and drape, including hand hygiene and sterile gloves were used. The patient was positioned and the spine was prepped. The skin was anesthetized with lidocaine.  Free flow of clear CSF was obtained prior to injecting local anesthetic into the CSF.  The spinal needle aspirated freely following injection.  The needle was carefully withdrawn.  The patient tolerated the procedure well.

## 2023-02-25 NOTE — Op Note (Signed)
Operative Note  Date of operation: 02/25/2023 Preoperative diagnosis: Left knee primary osteoarthritis Postoperative diagnosis: Same  Procedure: Left press-fit total knee arthroplasty  Implants: Biomet/Zimmer persona press-fit knee system Implant Name Type Inv. Item Serial No. Manufacturer Lot No. LRB No. Used Action  STEM TIB PERS SZ E 5D LT - [id number] Screw STEM TIB PERS SZ E 5D LT  ZIMMER RECON(ORTH,TRAU,BIO,SG) [id number] Left 1 Implanted  COMP FEM KNEE STD PS 9 LT - [id number] Joint COMP FEM KNEE STD PS 9 LT  ZIMMER RECON(ORTH,TRAU,BIO,SG) [id number] Left 1 Implanted  STEM ARTISURF EF 12 SZ8-11 - [id number] Stem STEM ARTISURF EF 12 SZ8-11  ZIMMER RECON(ORTH,TRAU,BIO,SG) [id number] Left 1 Implanted  COMP PATELLA PEG 3 32 - [id number] Joint COMP PATELLA PEG 3 32  ZIMMER RECON(ORTH,TRAU,BIO,SG) [id number] Left 1 Implanted   Surgeon: Vanita Panda. Magnus Ivan, MD Assistant: Rexene Edison, PA-C  Anesthesia: #1 left lower extremity adductor canal block, #2 spinal, #3 local Tourniquet time: 38 minutes EBL: Less than 50 cc Antibiotics: IV Ancef Complications: None  Indications: The patient is a 63 year old female with debilitating arthritis involving her left knee.  We actually replaced her right knee in 2023 and that is done well.  She has a remote history of both her hips being replaced elsewhere.  At this point her left knee pain is debilitating.  It is detrimentally affecting her mobility, her quality of life and her actives daily living.  Her pain is daily and she does wish to proceed with a knee replacement or left side given the severe arthritis of her left knee combined with the failure of conservative treatment.  Having had this before on the right side she is fully aware of the risks of acute blood loss anemia, nerve or vessel injury, fracture, infection, DVT, implant failure and wound healing issues.  She understands that our goals are hopefully decreased pain, improved mobility, and improved  quality of life.  Procedure description: After informed consent was obtained and the appropriate left knee was marked, anesthesia obtained a left lower extremity adductor canal block in the holding room.  The patient was then brought to the operating room and set up on the operating table where spinal anesthesia was obtained.  She was then laid in a supine position on the operating table and a Foley catheter was placed.  A nonsterile tractors placed around her upper left thigh and her left thigh, knee, leg and ankle were prepped and draped in DuraPrep and sterile drapes including a sterile stockinette.  A timeout was called and she was identified as the correct patient and the correct left knee.  An Esmarch was then used to wrap out the leg and the tourniquet was inflated to 300 mm pressure.  With the knee extended a direct midline incision was made over the patella and carried proximally distally.  Dissection was carried down to the knee joint and a medial parapatellar arthrotomy is made finding a large joint effusion.  With the knee in a flexed position we found significant cartilage wear of the medial compartment the knee and the patellofemoral joint.  Osteophytes removed from all 3 compartments as well as remnants of the ACL and medial lateral meniscus.  We then used an extramedullary based cutting guide for making her proximal tibia cut correction for varus and valgus and a 7 degree slope.  We made this cut to take 2 mm off the low side and we did backed this down to more millimeters.  We did find excellent quality bone for  63 year old female.  We then went to the femur and used an intramedullary based referencing guide for our distal femur cut setting this for a left knee at 5 degrees externally rotated and a 10 mm distal femoral cut.  We brought the knee back down to full extension and had achieved full extension with a 10 mm extension block.  We then went back to the femur and put a femoral sizing guide  based off the epicondylar axis.  Based off of this we chose a size 9 femur.  We put a 4-in-1 cutting block for a size 9 femur and made our anterior and posterior cuts followed by her chamfer cuts.  We found good quality bone on the femur as well.  We then backed the tibia and chose a size E left tibial tray for coverage over the tibial plateau setting the rotation of the tibial tubercle and the femur.  We did our drill hole and keel punch off of this and again we are pleased with the quality of the bone for proceeding with press-fit implants.  We then trialed our size E left tibial tray followed by our size 9 left CR standard femur.  We placed a 10 mm left medial congruent polythene insert and went up to a 12 mm thickness insert we are pleased the range of motion and stability without answer.  We then made a patella cut and drilled 3 holes for a size 32 press-fit patella button.  Again with all trial instrumentation in the knee we are pleased with range of motion and stability.  We then removed all transportation from the knee and irrigate the knee and normal saline solution.  We then placed Marcaine with epinephrine around the arthrotomy.  Next with the knee in a flexed position we placed our press-fit Biomet Zimmer persona tibial tray for a left knee size E followed by placing our size 9 left CR standard press-fit femur.  We placed our 12 mm thickness left medial congruent polythene insert and press-fit our size 32 patella button.  Again we are pleased with range of motion and stability with the implants.  The tourniquet was then let down and hemostasis was obtained with electrocautery.  The arthrotomy is closed with interrupted #1 Vicryl suture followed by 0 Vicryl to Goza deep tissue and 2-0 Vicryl to close subcutaneous tissue.  The skin was closed with staples.  Well-padded sterile dressings applied.  The patient was taken the recovery room.  Rexene Edison, PA-C did assist during the entire case and beginning to  end and his assistance was crucial and medically necessary for soft tissue management and retraction, helping guide implant placement and a layered closure of the wound.

## 2023-02-25 NOTE — Interval H&P Note (Signed)
History and Physical Interval Note: The patient understands that she is here today for a left total knee replacement to treat her significant left knee pain and arthritis.  There has been no acute or interval change in her medical status.  The risks and benefits of surgery have been discussed in detail and informed consent has been obtained.  The left operative knee has been marked.  02/25/2023 7:05 AM  Tracy Orozco  has presented today for surgery, with the diagnosis of OSTEOARTHRITIS LEFT KNEE.  The various methods of treatment have been discussed with the patient and family. After consideration of risks, benefits and other options for treatment, the patient has consented to  Procedure(s): LEFT TOTAL KNEE ARTHROPLASTY (Left) as a surgical intervention.  The patient's history has been reviewed, patient examined, no change in status, stable for surgery.  I have reviewed the patient's chart and labs.  Questions were answered to the patient's satisfaction.     Kathryne Hitch

## 2023-02-25 NOTE — TOC Transition Note (Signed)
Transition of Care [facility]) - Discharge Note   Patient Details  Name: Tracy Orozco MRN: [id number] Date of Birth: 07/07/1960  Transition of Care [facility] CM/SW Contact:  Amada Jupiter, LCSW Phone Number: 02/25/2023, 12:40 PM   Clinical Narrative:     Met with pt who confirms she has needed DME in the home.  HHPT prearranged with [facility] via Ortho MD office prior to surgery.  No further TOC needs.  Final next level of care: Home w Home Health Services Barriers to Discharge: No Barriers Identified   Patient Goals and CMS Choice Patient states their goals for this hospitalization and ongoing recovery are:: return home          Discharge Placement                       Discharge Plan and Services Additional resources added to the After Visit Summary for                  DME Arranged: N/A DME Agency: NA       HH Arranged: PT HH Agency: [facility]        Social Drivers of Health (SDOH) Interventions SDOH Screenings   Tobacco Use: Unknown (02/25/2023)     Readmission Risk Interventions     No data to display

## 2023-02-25 NOTE — Plan of Care (Signed)

## 2023-02-25 NOTE — Anesthesia Postprocedure Evaluation (Signed)
Anesthesia Post Note  Patient: Tracy Orozco  Procedure(s) Performed: LEFT TOTAL KNEE ARTHROPLASTY (Left: Knee)     Patient location during evaluation: PACU Anesthesia Type: Spinal and Regional Level of consciousness: oriented and awake and alert Pain management: pain level controlled Vital Signs Assessment: post-procedure vital signs reviewed and stable Respiratory status: spontaneous breathing and respiratory function stable Cardiovascular status: blood pressure returned to baseline and stable Postop Assessment: no headache, no backache, no apparent nausea or vomiting, spinal receding and patient able to bend at knees Anesthetic complications: no  No notable events documented.  Last Vitals:  Vitals:   02/25/23 1122 02/25/23 1317  BP: 116/61 117/68  Pulse: 74 84  Resp:  18  Temp: 36.4 C 36.9 C  SpO2: 99% 91%    Last Pain:  Vitals:   02/25/23 1353  TempSrc:   PainSc: 1                  Belinda Schlichting,W. EDMOND

## 2023-02-25 NOTE — Anesthesia Procedure Notes (Signed)
Anesthesia Regional Block: Adductor canal block   Pre-Anesthetic Checklist: , timeout performed,  Correct Patient, Correct Site, Correct Laterality,  Correct Procedure, Correct Position, site marked,  Risks and benefits discussed,  Pre-op evaluation,  At surgeon's request and post-op pain management  Laterality: Left  Prep: Maximum Sterile Barrier Precautions used, chloraprep       Needles:  Injection technique: Single-shot  Needle Type: Echogenic Stimulator Needle     Needle Length: 9cm  Needle Gauge: 21     Additional Needles:   Procedures:,,,, ultrasound used (permanent image in chart),,    Narrative:  Start time: 02/25/2023 6:55 AM End time: 02/25/2023 7:05 AM Injection made incrementally with aspirations every 5 mL.  Performed by: Personally  Anesthesiologist: Gaynelle Adu, MD  Additional Notes:

## 2023-02-26 DIAGNOSIS — I10 Essential (primary) hypertension: Secondary | ICD-10-CM | POA: Diagnosis not present

## 2023-02-26 DIAGNOSIS — M1712 Unilateral primary osteoarthritis, left knee: Secondary | ICD-10-CM | POA: Diagnosis not present

## 2023-02-26 DIAGNOSIS — Z79899 Other long term (current) drug therapy: Secondary | ICD-10-CM | POA: Diagnosis not present

## 2023-02-26 DIAGNOSIS — Z96643 Presence of artificial hip joint, bilateral: Secondary | ICD-10-CM | POA: Diagnosis not present

## 2023-02-26 DIAGNOSIS — Z8616 Personal history of COVID-19: Secondary | ICD-10-CM | POA: Diagnosis not present

## 2023-02-26 DIAGNOSIS — Z96651 Presence of right artificial knee joint: Secondary | ICD-10-CM | POA: Diagnosis not present

## 2023-02-26 LAB — CBC
HCT: 39.3 % (ref 36.0–46.0)
Hemoglobin: 12.3 g/dL (ref 12.0–15.0)
MCH: 29.6 pg (ref 26.0–34.0)
MCHC: 31.3 g/dL (ref 30.0–36.0)
MCV: 94.7 fL (ref 80.0–100.0)
Platelets: 239 10*3/uL (ref 150–400)
RBC: 4.15 MIL/uL (ref 3.87–5.11)
RDW: 13.4 % (ref 11.5–15.5)
WBC: 11.8 10*3/uL — ABNORMAL HIGH (ref 4.0–10.5)
nRBC: 0 % (ref 0.0–0.2)

## 2023-02-26 LAB — BASIC METABOLIC PANEL
Anion gap: 10 (ref 5–15)
BUN: 15 mg/dL (ref 8–23)
CO2: 25 mmol/L (ref 22–32)
Calcium: 9.1 mg/dL (ref 8.9–10.3)
Chloride: 105 mmol/L (ref 98–111)
Creatinine, Ser: 0.65 mg/dL (ref 0.44–1.00)
GFR, Estimated: >60 mL/min (ref >60)
Glucose, Bld: 141 mg/dL — ABNORMAL HIGH (ref 70–99)
Potassium: 4.3 mmol/L (ref 3.5–5.1)
Sodium: 140 mmol/L (ref 135–145)

## 2023-02-26 MED ORDER — METHOCARBAMOL 500 MG PO TABS: 500.0000 mg | ORAL_TABLET | Freq: Four times a day (QID) | ORAL | 1 refills | Status: DC | PRN

## 2023-02-26 MED ORDER — OXYCODONE HCL 5 MG PO TABS: 5.0000 mg | ORAL_TABLET | ORAL | 0 refills | Status: DC | PRN

## 2023-02-26 MED ORDER — ASPIRIN 81 MG PO CHEW: 81.0000 mg | CHEWABLE_TABLET | Freq: Two times a day (BID) | ORAL | 0 refills | Status: DC

## 2023-02-26 NOTE — Plan of Care (Signed)

## 2023-02-26 NOTE — Progress Notes (Signed)
Subjective: 1 Day Post-Op Procedure(s) (LRB): LEFT TOTAL KNEE ARTHROPLASTY (Left) Patient reports pain as moderate.    Objective: Vital signs in last 24 hours: Temp:  [97.5 F (36.4 C)-98.7 F (37.1 C)] 97.8 F (36.6 C) (01/25 0840) Pulse Rate:  [70-88] 70 (01/25 0840) Resp:  [16-18] 17 (01/25 0840) BP: (109-132)/(53-73) 109/53 (01/25 0840) SpO2:  [91 %-99 %] 94 % (01/25 0840)  Intake/Output from previous day: 01/24 0701 - 01/25 0700 In: 2867.7 [P.O.:598; I.V.:1969.7; IV Piggyback:300] Out: 2795 [Urine:2775; Blood:20] Intake/Output this shift: Total I/O In: 590 [P.O.:240; I.V.:350] Out: 200 [Urine:200]  Recent Labs    02/25/23 1048 02/26/23 0345  HGB 13.3 12.3   Recent Labs    02/25/23 1048 02/26/23 0345  WBC 7.4 11.8*  RBC 4.47 4.15  HCT 41.2 39.3  PLT 240 239   Recent Labs    02/25/23 1048 02/26/23 0345  NA 142 140  K 3.9 4.3  CL 106 105  CO2 25 25  BUN 16 15  CREATININE 0.60 0.65  GLUCOSE 118* 141*  CALCIUM 9.4 9.1   No results for input(s): "LABPT", "INR" in the last 72 hours.  Sensation intact distally Intact pulses distally Dorsiflexion/Plantar flexion intact Incision: moderate drainage Compartment soft   Assessment/Plan: 1 Day Post-Op Procedure(s) (LRB): LEFT TOTAL KNEE ARTHROPLASTY (Left) Up with therapy Discharge home with home health this afternoon if doing well.      Tracy Orozco 02/26/2023, 11:23 AM

## 2023-02-26 NOTE — Progress Notes (Signed)
Provided discharge education/instructions, all questions answered. Pt is not in any distress. Pt to discharge home with all of her belongings accompanied by her sister.

## 2023-02-26 NOTE — Discharge Instructions (Signed)
Per [facility] clinic policy, our goal is ensure optimal postoperative pain control with a multimodal pain management strategy. For all [facility] patients, our goal is to wean post-operative narcotic medications by 6 weeks post-operatively. If this is not possible due to utilization of pain medication prior to surgery, your [facility] doctor will support your acute post-operative pain control for the first 6 weeks postoperatively, with a plan to transition you back to your primary pain team following that. Tracy Orozco will work to ensure a Therapist, occupational.  INSTRUCTIONS AFTER JOINT REPLACEMENT   Remove items at home which could result in a fall. This includes throw rugs or furniture in walking pathways ICE to the affected joint every three hours while awake for 30 minutes at a time, for at least the first 3-5 days, and then as needed for pain and swelling.  Continue to use ice for pain and swelling. You may notice swelling that will progress down to the foot and ankle.  This is normal after surgery.  Elevate your leg when you are not up walking on it.   Continue to use the breathing machine you got in the hospital (incentive spirometer) which will help keep your temperature down.  It is common for your temperature to cycle up and down following surgery, especially at night when you are not up moving around and exerting yourself.  The breathing machine keeps your lungs expanded and your temperature down.   DIET:  As you were doing prior to hospitalization, we recommend a well-balanced diet.  DRESSING / WOUND CARE / SHOWERING  Keep the surgical dressing until follow up.  The dressing is water proof, so you can shower without any extra covering.  IF THE DRESSING FALLS OFF or the wound gets wet inside, change the dressing with sterile gauze.  Please use good hand washing techniques before changing the dressing.  Do not use any lotions or creams on the incision until instructed by your surgeon.     ACTIVITY  Increase activity slowly as tolerated, but follow the weight bearing instructions below.   No driving for 6 weeks or until further direction given by your physician.  You cannot drive while taking narcotics.  No lifting or carrying greater than 10 lbs. until further directed by your surgeon. Avoid periods of inactivity such as sitting longer than an hour when not asleep. This helps prevent blood clots.  You may return to work once you are authorized by your doctor.     WEIGHT BEARING   Weight bearing as tolerated with assist device (walker, cane, etc) as directed, use it as long as suggested by your surgeon or therapist, typically at least 4-6 weeks.   EXERCISES  Results after joint replacement surgery are often greatly improved when you follow the exercise, range of motion and muscle strengthening exercises prescribed by your doctor. Safety measures are also important to protect the joint from further injury. Any time any of these exercises cause you to have increased pain or swelling, decrease what you are doing until you are comfortable again and then slowly increase them. If you have problems or questions, call your caregiver or physical therapist for advice.   Rehabilitation is important following a joint replacement. After just a few days of immobilization, the muscles of the leg can become weakened and shrink (atrophy).  These exercises are designed to build up the tone and strength of the thigh and leg muscles and to improve motion. Often times heat used for twenty to thirty minutes before  working out will loosen up your tissues and help with improving the range of motion but do not use heat for the first two weeks following surgery (sometimes heat can increase post-operative swelling).   These exercises can be done on a training (exercise) mat, on the floor, on a table or on a bed. Use whatever works the best and is most comfortable for you.    Use music or television  while you are exercising so that the exercises are a pleasant break in your day. This will make your life better with the exercises acting as a break in your routine that you can look forward to.   Perform all exercises about fifteen times, three times per day or as directed.  You should exercise both the operative leg and the other leg as well.  Exercises include:   Quad Sets - Tighten up the muscle on the front of the thigh (Quad) and hold for 5-10 seconds.   Straight Leg Raises - With your knee straight (if you were given a brace, keep it on), lift the leg to 60 degrees, hold for 3 seconds, and slowly lower the leg.  Perform this exercise against resistance later as your leg gets stronger.  Leg Slides: Lying on your back, slowly slide your foot toward your buttocks, bending your knee up off the floor (only go as far as is comfortable). Then slowly slide your foot back down until your leg is flat on the floor again.  Angel Wings: Lying on your back spread your legs to the side as far apart as you can without causing discomfort.  Hamstring Strength:  Lying on your back, push your heel against the floor with your leg straight by tightening up the muscles of your buttocks.  Repeat, but this time bend your knee to a comfortable angle, and push your heel against the floor.  You may put a pillow under the heel to make it more comfortable if necessary.   A rehabilitation program following joint replacement surgery can speed recovery and prevent re-injury in the future due to weakened muscles. Contact your doctor or a physical therapist for more information on knee rehabilitation.    CONSTIPATION  Constipation is defined medically as fewer than three stools per week and severe constipation as less than one stool per week.  Even if you have a regular bowel pattern at home, your normal regimen is likely to be disrupted due to multiple reasons following surgery.  Combination of anesthesia, postoperative  narcotics, change in appetite and fluid intake all can affect your bowels.   YOU MUST use at least one of the following options; they are listed in order of increasing strength to get the job done.  They are all available over the counter, and you may need to use some, POSSIBLY even all of these options:    Drink plenty of fluids (prune juice may be helpful) and high fiber foods Colace 100 mg by mouth twice a day  Senokot for constipation as directed and as needed Dulcolax (bisacodyl), take with full glass of water  Miralax (polyethylene glycol) once or twice a day as needed.  If you have tried all these things and are unable to have a bowel movement in the first 3-4 days after surgery call either your surgeon or your primary doctor.    If you experience loose stools or diarrhea, hold the medications until you stool forms back up.  If your symptoms do not get better within 1 week  or if they get worse, check with your doctor.  If you experience "the worst abdominal pain ever" or develop nausea or vomiting, please contact the office immediately for further recommendations for treatment.   ITCHING:  If you experience itching with your medications, try taking only a single pain pill, or even half a pain pill at a time.  You can also use Benadryl over the counter for itching or also to help with sleep.   TED HOSE STOCKINGS:  Use stockings on both legs until for at least 2 weeks or as directed by physician office. They may be removed at night for sleeping.  MEDICATIONS:  See your medication summary on the "After Visit Summary" that nursing will review with you.  You may have some home medications which will be placed on hold until you complete the course of blood thinner medication.  It is important for you to complete the blood thinner medication as prescribed.  PRECAUTIONS:  If you experience chest pain or shortness of breath - call 911 immediately for transfer to the hospital emergency department.    If you develop a fever greater that 101 F, purulent drainage from wound, increased redness or drainage from wound, foul odor from the wound/dressing, or calf pain - CONTACT YOUR SURGEON.                                                   FOLLOW-UP APPOINTMENTS:  If you do not already have a post-op appointment, please call the office for an appointment to be seen by your surgeon.  Guidelines for how soon to be seen are listed in your "After Visit Summary", but are typically between 1-4 weeks after surgery.  OTHER INSTRUCTIONS:   Knee Replacement:  Do not place pillow under knee, focus on keeping the knee straight while resting. CPM instructions: 0-90 degrees, 2 hours in the morning, 2 hours in the afternoon, and 2 hours in the evening. Place foam block, curve side up under heel at all times except when in CPM or when walking.  DO NOT modify, tear, cut, or change the foam block in any way.  POST-OPERATIVE OPIOID TAPER INSTRUCTIONS: It is important to wean off of your opioid medication as soon as possible. If you do not need pain medication after your surgery it is ok to stop day one. Opioids include: Codeine, Hydrocodone(Norco, Vicodin), Oxycodone(Percocet, oxycontin) and hydromorphone amongst others.  Long term and even short term use of opiods can cause: Increased pain response Dependence Constipation Depression Respiratory depression And more.  Withdrawal symptoms can include Flu like symptoms Nausea, vomiting And more Techniques to manage these symptoms Hydrate well Eat regular healthy meals Stay active Use relaxation techniques(deep breathing, meditating, yoga) Do Not substitute Alcohol to help with tapering If you have been on opioids for less than two weeks and do not have pain than it is ok to stop all together.  Plan to wean off of opioids This plan should start within one week post op of your joint replacement. Maintain the same interval or time between taking each dose  and first decrease the dose.  Cut the total daily intake of opioids by one tablet each day Next start to increase the time between doses. The last dose that should be eliminated is the evening dose.   MAKE SURE YOU:  Understand these instructions.  Get help right away if you are not doing well or get worse.    Thank you for letting us be a part of your medical care team.  It is a privilege we respect greatly.  We hope these instructions will help you stay on track for a fast and full recovery!      Dental Antibiotics:  In most cases prophylactic antibiotics for Dental procdeures after total joint surgery are not necessary.  Exceptions are as follows:  1. History of prior total joint infection  2. Severely immunocompromised (Organ Transplant, cancer chemotherapy, Rheumatoid biologic meds such as Humera)  3. Poorly controlled diabetes (A1C &gt; 8.0, blood glucose over 200)  If you have one of these conditions, contact your surgeon for an antibiotic prescription, prior to your dental procedure.

## 2023-02-26 NOTE — Discharge Summary (Signed)
Patient ID: Tracy Orozco MRN: [id number] DOB/AGE: [birth year] 63 y.o.  Admit date: 02/25/2023 Discharge date: 02/26/2023  Admission Diagnoses:  Principal Problem:   Unilateral primary osteoarthritis, left knee Active Problems:   Status post total left knee replacement   Discharge Diagnoses:  Same  Past Medical History:  Diagnosis Date   Arthritis    Hypertension     Surgeries: Procedure(s): LEFT TOTAL KNEE ARTHROPLASTY on 02/25/2023   Consultants:   Discharged Condition: Improved  Hospital Course: Tracy Orozco is an 63 y.o. female who was admitted 02/25/2023 for operative treatment ofUnilateral primary osteoarthritis, left knee. Patient has severe unremitting pain that affects sleep, daily activities, and work/hobbies. After pre-op clearance the patient was taken to the operating room on 02/25/2023 and underwent  Procedure(s): LEFT TOTAL KNEE ARTHROPLASTY.    Patient was given perioperative antibiotics:  Anti-infectives (From admission, onward)    Start     Dose/Rate Route Frequency Ordered Stop   02/25/23 1400  ceFAZolin (ANCEF) IVPB 2g/100 mL premix        2 g 200 mL/hr over 30 Minutes Intravenous Every 6 hours 02/25/23 1122 02/26/23 0807   02/25/23 0600  ceFAZolin (ANCEF) IVPB 2g/100 mL premix        2 g 200 mL/hr over 30 Minutes Intravenous On call to O.R. 02/25/23 0534 02/25/23 1478        Patient was given sequential compression devices, early ambulation, and chemoprophylaxis to prevent DVT.  Patient benefited maximally from hospital stay and there were no complications.    Recent vital signs: Patient Vitals for the past 24 hrs:  BP Temp Temp src Pulse Resp SpO2  02/26/23 1326 127/70 98.2 F (36.8 C) Oral 77 18 97 %  02/26/23 0840 (!) 109/53 97.8 F (36.6 C) Oral 70 17 94 %  02/26/23 0526 122/67 98.7 F (37.1 C) -- 81 16 96 %  02/26/23 0128 132/70 98.7 F (37.1 C) -- 88 17 97 %  02/25/23 2003 126/73 98.4 F (36.9 C) -- 79 18 99 %  02/25/23 1817 116/63 (!)  97.5 F (36.4 C) Oral 75 18 94 %     Recent laboratory studies:  Recent Labs    02/25/23 1048 02/26/23 0345  WBC 7.4 11.8*  HGB 13.3 12.3  HCT 41.2 39.3  PLT 240 239  NA 142 140  K 3.9 4.3  CL 106 105  CO2 25 25  BUN 16 15  CREATININE 0.60 0.65  GLUCOSE 118* 141*  CALCIUM 9.4 9.1     Discharge Medications:   Allergies as of 02/26/2023       Reactions   Pollen Extract    Other Reaction(s): Unknown   Morphine And Codeine Palpitations, Other (See Comments)   "Heart pounds".        Medication List     TAKE these medications    ascorbic acid 500 MG tablet Commonly known as: VITAMIN C Take 1 tablet (500 mg total) by mouth daily.   aspirin 81 MG chewable tablet Chew 1 tablet (81 mg total) by mouth 2 (two) times daily.   calcium gluconate 500 MG tablet Take 1 tablet by mouth daily. Vit d 600 mg /20 mcg Notes to patient: Resume home regimen   cetirizine 10 MG tablet Commonly known as: ZYRTEC Take 10 mg by mouth daily. Notes to patient: Resume home regimen   GLUCOSAMINE-CHONDROITIN PO Take 2 tablets by mouth daily. 1500/1200 Notes to patient: Resume home regimen   ibuprofen 200 MG tablet Commonly known as: ADVIL  Take 800 mg by mouth daily. Notes to patient: Resume home regimen   Magnesium 250 MG Tabs Take 250 mg by mouth daily.   methocarbamol 500 MG tablet Commonly known as: ROBAXIN Take 1 tablet (500 mg total) by mouth every 6 (six) hours as needed for muscle spasms. Notes to patient: Last dose given 01/25 05:42am   multivitamin with minerals Tabs tablet Take 1 tablet by mouth daily. Woman 50+ Notes to patient: Resume home regimen   oxyCODONE 5 MG immediate release tablet Commonly known as: Oxy IR/ROXICODONE Take 1-2 tablets (5-10 mg total) by mouth every 4 (four) hours as needed for moderate pain (pain score 4-6) (pain score 4-6). Notes to patient: Last dose given 01/25 08:43am   valsartan 160 MG tablet Commonly known as: DIOVAN Take 160  mg by mouth daily. Notes to patient: Resume home regimen   VITAMIN D PO Take 5,000 Units by mouth daily. Notes to patient: Resume home regimen   zinc sulfate (50mg  elemental zinc) 220 (50 Zn) MG capsule Take 1 capsule (220 mg total) by mouth daily.               Durable Medical Equipment  (From admission, onward)           Start     Ordered   02/25/23 1123  DME 3 n 1  Once        02/25/23 1122   02/25/23 1123  DME Walker rolling  Once       Question Answer Comment  Walker: With 5 Inch Wheels   Patient needs a walker to treat with the following condition Status post total left knee replacement      02/25/23 1122            Diagnostic Studies: DG Knee Left Port Result Date: 02/25/2023 CLINICAL DATA:  Status post left knee replacement. EXAM: PORTABLE LEFT KNEE - 1-2 VIEW COMPARISON:  None Available. FINDINGS: Left knee arthroplasty in expected alignment. No periprosthetic lucency or fracture. Recent postsurgical change includes air and edema in the soft tissues and joint space. Anterior skin staples in place. IMPRESSION: Left knee arthroplasty without immediate postoperative complication. Electronically Signed   By: Narda Rutherford M.D.   On: 02/25/2023 10:28    Disposition: Discharge disposition: 01-Home or Self Care          Follow-up Information     [facility] Follow up.   Specialty: Home Health Services Why: to provide home physical therapy visits Contact information: [address] [place] [address] [phone]         Jobe Gibbon, [facility] .   Specialty: Home Health Services Contact information: [address] [place] [address] [place] [address] [phone]         Kathryne Hitch, MD Follow up in 2 week(s).   Specialty: Orthopedic Surgery Contact information: [address] [place] [address] [phone]                  Signed: Kathryne Hitch 02/26/2023, 5:30  PM

## 2023-02-26 NOTE — Plan of Care (Signed)

## 2023-02-26 NOTE — Progress Notes (Signed)
Physical Therapy Treatment Patient Details Name: Tracy Orozco MRN: [id number] DOB: December 10, 1960 Today's Date: 02/26/2023   History of Present Illness Pt is a 63 year old female s/p L TKA on 02/25/23.  PMHx: R TKA, bil THAs, spinal fusion L5-S1    PT Comments  Pt ambulating back to bed with nurse tech on arrival. Pt ambulated in hallway and practiced safe stair technique.  Pt feels ready to d/c home with sister today.  Provided HEP and stair handouts.  Pt had no further questions.    If plan is discharge home, recommend the following: Help with stairs or ramp for entrance   Can travel by private vehicle        Equipment Recommendations  None recommended by PT    Recommendations for Other Services       Precautions / Restrictions Precautions Precautions: Fall;Knee Restrictions Weight Bearing Restrictions Per Provider Order: Yes LLE Weight Bearing Per Provider Order: Weight bearing as tolerated     Mobility  Bed Mobility Overal bed mobility: Needs Assistance Bed Mobility: Sit to Supine       Sit to supine: Supervision   General bed mobility comments: increased time and effort due to fatigue and pain    Transfers Overall transfer level: Needs assistance Equipment used: Rolling walker (2 wheels) Transfers: Sit to/from Stand Sit to Stand: Supervision, Contact guard assist           General transfer comment: verbal cues for UE and LE positioning for pain control    Ambulation/Gait Ambulation/Gait assistance: Contact guard assist, Supervision Gait Distance (Feet): 60 Feet Assistive device: Rolling walker (2 wheels) Gait Pattern/deviations: Step-to pattern, Decreased stance time - left, Antalgic Gait velocity: decr     General Gait Details: verbal cues for sequence, RW positioning, step length   Stairs Stairs: Yes Stairs assistance: Contact guard assist Stair Management: Step to pattern, Backwards, With walker Number of Stairs: 2 General stair comments:  verbal cues for safety and sequence; provided handout on technique   Wheelchair Mobility     Tilt Bed    Modified Rankin (Stroke Patients Only)       Balance                                            Cognition Arousal: Alert Behavior During Therapy: WFL for tasks assessed/performed Overall Cognitive Status: Within Functional Limits for tasks assessed                                          Exercises    General Comments        Pertinent Vitals/Pain Pain Assessment Pain Assessment: 0-10 Pain Score: 6  Pain Location: left knee Pain Descriptors / Indicators: Sore, Aching Pain Intervention(s): Monitored during session, Repositioned, Ice applied    Home Living                          Prior Function            PT Goals (current goals can now be found in the care plan section) Progress towards PT goals: Progressing toward goals    Frequency    7X/week      PT Plan      Co-evaluation  AM-PAC PT "6 Clicks" Mobility   Outcome Measure  Help needed turning from your back to your side while in a flat bed without using bedrails?: A Little Help needed moving from lying on your back to sitting on the side of a flat bed without using bedrails?: A Little Help needed moving to and from a bed to a chair (including a wheelchair)?: A Little Help needed standing up from a chair using your arms (e.g., wheelchair or bedside chair)?: A Little Help needed to walk in hospital room?: A Little Help needed climbing 3-5 steps with a railing? : A Little 6 Click Score: 18    End of Session Equipment Utilized During Treatment: Gait belt Activity Tolerance: Patient tolerated treatment well Patient left: in bed;with call bell/phone within reach;with bed alarm set Nurse Communication: Mobility status PT Visit Diagnosis: Difficulty in walking, not elsewhere classified (R26.2)     Time: 1358-1410 PT Time  Calculation (min) (ACUTE ONLY): 12 min  Charges:    $Gait Training: 8-22 mins  PT General Charges $$ ACUTE PT VISIT: 1 Visit                     Paulino Door, DPT Physical Therapist Acute Rehabilitation Services Office: [phone]    Janan Halter [place] 02/26/2023, 3:01 PM

## 2023-02-26 NOTE — Progress Notes (Signed)
Physical Therapy Treatment Patient Details Name: Tracy Orozco MRN: [id number] DOB: 12/14/1960 Today's Date: 02/26/2023   History of Present Illness Pt is a 63 year old female s/p L TKA on 02/25/23.  PMHx: R TKA, bil THAs, spinal fusion L5-S1    PT Comments  Pt ambulated in hallway and performed LE exercises.  Pt reports a rough night with pain but premedicated for session.  Pt anticipates d/c home later today after practicing steps.     If plan is discharge home, recommend the following: Help with stairs or ramp for entrance   Can travel by private vehicle        Equipment Recommendations  None recommended by PT    Recommendations for Other Services       Precautions / Restrictions Precautions Precautions: Fall;Knee Restrictions LLE Weight Bearing Per Provider Order: Weight bearing as tolerated     Mobility  Bed Mobility Overal bed mobility: Needs Assistance Bed Mobility: Sit to Supine       Sit to supine: Supervision   General bed mobility comments: verbal cues for technique and self assist    Transfers Overall transfer level: Needs assistance Equipment used: Rolling walker (2 wheels) Transfers: Sit to/from Stand Sit to Stand: Contact guard assist           General transfer comment: verbal cues for UE and LE positioning for pain control    Ambulation/Gait Ambulation/Gait assistance: Contact guard assist Gait Distance (Feet): 90 Feet Assistive device: Rolling walker (2 wheels) Gait Pattern/deviations: Step-to pattern, Decreased stance time - left, Antalgic Gait velocity: decr     General Gait Details: verbal cues for sequence, RW positioning, step length, allowing knee flexion as tolerated (don't keep leg stiff)   Stairs             Wheelchair Mobility     Tilt Bed    Modified Rankin (Stroke Patients Only)       Balance                                            Cognition Arousal: Alert Behavior During Therapy:  WFL for tasks assessed/performed Overall Cognitive Status: Within Functional Limits for tasks assessed                                          Exercises Total Joint Exercises Ankle Circles/Pumps: AROM, Both, 10 reps Quad Sets: AROM, Left, 10 reps Heel Slides: AAROM, Left, 10 reps, Supine Hip ABduction/ADduction: AAROM, Left, 10 reps Straight Leg Raises: AAROM, Left, 10 reps    General Comments        Pertinent Vitals/Pain Pain Assessment Pain Assessment: 0-10 Pain Score: 6  Pain Location: left knee Pain Descriptors / Indicators: Sore, Aching Pain Intervention(s): Repositioned, Monitored during session, Premedicated before session, Ice applied    Home Living                          Prior Function            PT Goals (current goals can now be found in the care plan section) Progress towards PT goals: Progressing toward goals    Frequency    7X/week      PT Plan      Co-evaluation  AM-PAC PT "6 Clicks" Mobility   Outcome Measure  Help needed turning from your back to your side while in a flat bed without using bedrails?: A Little Help needed moving from lying on your back to sitting on the side of a flat bed without using bedrails?: A Little Help needed moving to and from a bed to a chair (including a wheelchair)?: A Little Help needed standing up from a chair using your arms (e.g., wheelchair or bedside chair)?: A Little Help needed to walk in hospital room?: A Little Help needed climbing 3-5 steps with a railing? : A Little 6 Click Score: 18    End of Session Equipment Utilized During Treatment: Gait belt Activity Tolerance: Patient tolerated treatment well Patient left: in bed;with call bell/phone within reach;with bed alarm set Nurse Communication: Mobility status PT Visit Diagnosis: Difficulty in walking, not elsewhere classified (R26.2)     Time: 4098-1191 PT Time Calculation (min) (ACUTE ONLY): 24  min  Charges:    $Gait Training: 8-22 mins $Therapeutic Exercise: 8-22 mins PT General Charges $$ ACUTE PT VISIT: 1 Visit                     Paulino Door, DPT Physical Therapist Acute Rehabilitation Services Office: [phone]    Janan Halter Payson 02/26/2023, 12:43 PM

## 2023-02-27 DIAGNOSIS — M6281 Muscle weakness (generalized): Secondary | ICD-10-CM | POA: Diagnosis not present

## 2023-02-27 DIAGNOSIS — Z981 Arthrodesis status: Secondary | ICD-10-CM | POA: Diagnosis not present

## 2023-02-27 DIAGNOSIS — Z471 Aftercare following joint replacement surgery: Secondary | ICD-10-CM | POA: Diagnosis not present

## 2023-02-27 DIAGNOSIS — Z8616 Personal history of COVID-19: Secondary | ICD-10-CM | POA: Diagnosis not present

## 2023-02-27 DIAGNOSIS — Z96651 Presence of right artificial knee joint: Secondary | ICD-10-CM | POA: Diagnosis not present

## 2023-02-27 DIAGNOSIS — Z6838 Body mass index (BMI) 38.0-38.9, adult: Secondary | ICD-10-CM | POA: Diagnosis not present

## 2023-02-27 DIAGNOSIS — Z96652 Presence of left artificial knee joint: Secondary | ICD-10-CM | POA: Diagnosis not present

## 2023-02-27 DIAGNOSIS — Z7982 Long term (current) use of aspirin: Secondary | ICD-10-CM | POA: Diagnosis not present

## 2023-02-27 DIAGNOSIS — I10 Essential (primary) hypertension: Secondary | ICD-10-CM | POA: Diagnosis not present

## 2023-02-27 DIAGNOSIS — Z96643 Presence of artificial hip joint, bilateral: Secondary | ICD-10-CM | POA: Diagnosis not present

## 2023-02-27 DIAGNOSIS — E669 Obesity, unspecified: Secondary | ICD-10-CM | POA: Diagnosis not present

## 2023-02-28 ENCOUNTER — Encounter (HOSPITAL_COMMUNITY): Payer: Self-pay | Admitting: Orthopaedic Surgery

## 2023-02-28 ENCOUNTER — Telehealth: Payer: Self-pay | Admitting: Orthopaedic Surgery

## 2023-02-28 NOTE — Telephone Encounter (Signed)
Patient aware this is ok

## 2023-02-28 NOTE — Telephone Encounter (Signed)
Patient called asked if she could start taking her Ibuprofen  800 mg again. The number to contact patient is [phone]

## 2023-03-01 DIAGNOSIS — E669 Obesity, unspecified: Secondary | ICD-10-CM | POA: Diagnosis not present

## 2023-03-01 DIAGNOSIS — Z96643 Presence of artificial hip joint, bilateral: Secondary | ICD-10-CM | POA: Diagnosis not present

## 2023-03-01 DIAGNOSIS — Z981 Arthrodesis status: Secondary | ICD-10-CM | POA: Diagnosis not present

## 2023-03-01 DIAGNOSIS — Z96651 Presence of right artificial knee joint: Secondary | ICD-10-CM | POA: Diagnosis not present

## 2023-03-01 DIAGNOSIS — I10 Essential (primary) hypertension: Secondary | ICD-10-CM | POA: Diagnosis not present

## 2023-03-01 DIAGNOSIS — Z96652 Presence of left artificial knee joint: Secondary | ICD-10-CM | POA: Diagnosis not present

## 2023-03-01 DIAGNOSIS — Z7982 Long term (current) use of aspirin: Secondary | ICD-10-CM | POA: Diagnosis not present

## 2023-03-01 DIAGNOSIS — M6281 Muscle weakness (generalized): Secondary | ICD-10-CM | POA: Diagnosis not present

## 2023-03-01 DIAGNOSIS — Z6838 Body mass index (BMI) 38.0-38.9, adult: Secondary | ICD-10-CM | POA: Diagnosis not present

## 2023-03-01 DIAGNOSIS — Z471 Aftercare following joint replacement surgery: Secondary | ICD-10-CM | POA: Diagnosis not present

## 2023-03-01 DIAGNOSIS — Z8616 Personal history of COVID-19: Secondary | ICD-10-CM | POA: Diagnosis not present

## 2023-03-02 ENCOUNTER — Telehealth: Payer: Self-pay | Admitting: Orthopaedic Surgery

## 2023-03-02 ENCOUNTER — Other Ambulatory Visit: Payer: Self-pay | Admitting: Orthopaedic Surgery

## 2023-03-02 MED ORDER — OXYCODONE HCL 5 MG PO TABS: 5.0000 mg | ORAL_TABLET | ORAL | 0 refills | Status: DC | PRN

## 2023-03-02 NOTE — Telephone Encounter (Signed)
Patient called advised her Rx got sent to the [facility] on [address] in error. [facility] do not have the medication. Patient asked if the Oxycodone can be sent to [facility] [address]?  The number to contact patient is [phone]

## 2023-03-02 NOTE — Telephone Encounter (Signed)
Rx refill Oxycodone 5mg   [facility] [address] [employer]

## 2023-03-03 DIAGNOSIS — Z96643 Presence of artificial hip joint, bilateral: Secondary | ICD-10-CM | POA: Diagnosis not present

## 2023-03-03 DIAGNOSIS — Z8616 Personal history of COVID-19: Secondary | ICD-10-CM | POA: Diagnosis not present

## 2023-03-03 DIAGNOSIS — Z96651 Presence of right artificial knee joint: Secondary | ICD-10-CM | POA: Diagnosis not present

## 2023-03-03 DIAGNOSIS — Z96652 Presence of left artificial knee joint: Secondary | ICD-10-CM | POA: Diagnosis not present

## 2023-03-03 DIAGNOSIS — Z6838 Body mass index (BMI) 38.0-38.9, adult: Secondary | ICD-10-CM | POA: Diagnosis not present

## 2023-03-03 DIAGNOSIS — Z471 Aftercare following joint replacement surgery: Secondary | ICD-10-CM | POA: Diagnosis not present

## 2023-03-03 DIAGNOSIS — Z7982 Long term (current) use of aspirin: Secondary | ICD-10-CM | POA: Diagnosis not present

## 2023-03-03 DIAGNOSIS — I10 Essential (primary) hypertension: Secondary | ICD-10-CM | POA: Diagnosis not present

## 2023-03-03 DIAGNOSIS — M6281 Muscle weakness (generalized): Secondary | ICD-10-CM | POA: Diagnosis not present

## 2023-03-03 DIAGNOSIS — E669 Obesity, unspecified: Secondary | ICD-10-CM | POA: Diagnosis not present

## 2023-03-03 DIAGNOSIS — Z981 Arthrodesis status: Secondary | ICD-10-CM | POA: Diagnosis not present

## 2023-03-07 DIAGNOSIS — Z7982 Long term (current) use of aspirin: Secondary | ICD-10-CM | POA: Diagnosis not present

## 2023-03-07 DIAGNOSIS — Z96652 Presence of left artificial knee joint: Secondary | ICD-10-CM | POA: Diagnosis not present

## 2023-03-07 DIAGNOSIS — Z981 Arthrodesis status: Secondary | ICD-10-CM | POA: Diagnosis not present

## 2023-03-07 DIAGNOSIS — Z8616 Personal history of COVID-19: Secondary | ICD-10-CM | POA: Diagnosis not present

## 2023-03-07 DIAGNOSIS — M6281 Muscle weakness (generalized): Secondary | ICD-10-CM | POA: Diagnosis not present

## 2023-03-07 DIAGNOSIS — E669 Obesity, unspecified: Secondary | ICD-10-CM | POA: Diagnosis not present

## 2023-03-07 DIAGNOSIS — Z96651 Presence of right artificial knee joint: Secondary | ICD-10-CM | POA: Diagnosis not present

## 2023-03-07 DIAGNOSIS — Z6838 Body mass index (BMI) 38.0-38.9, adult: Secondary | ICD-10-CM | POA: Diagnosis not present

## 2023-03-07 DIAGNOSIS — Z471 Aftercare following joint replacement surgery: Secondary | ICD-10-CM | POA: Diagnosis not present

## 2023-03-07 DIAGNOSIS — Z96643 Presence of artificial hip joint, bilateral: Secondary | ICD-10-CM | POA: Diagnosis not present

## 2023-03-07 DIAGNOSIS — I10 Essential (primary) hypertension: Secondary | ICD-10-CM | POA: Diagnosis not present

## 2023-03-09 DIAGNOSIS — Z471 Aftercare following joint replacement surgery: Secondary | ICD-10-CM | POA: Diagnosis not present

## 2023-03-09 DIAGNOSIS — Z96651 Presence of right artificial knee joint: Secondary | ICD-10-CM | POA: Diagnosis not present

## 2023-03-09 DIAGNOSIS — Z96643 Presence of artificial hip joint, bilateral: Secondary | ICD-10-CM | POA: Diagnosis not present

## 2023-03-09 DIAGNOSIS — Z981 Arthrodesis status: Secondary | ICD-10-CM | POA: Diagnosis not present

## 2023-03-09 DIAGNOSIS — I10 Essential (primary) hypertension: Secondary | ICD-10-CM | POA: Diagnosis not present

## 2023-03-09 DIAGNOSIS — M6281 Muscle weakness (generalized): Secondary | ICD-10-CM | POA: Diagnosis not present

## 2023-03-09 DIAGNOSIS — E669 Obesity, unspecified: Secondary | ICD-10-CM | POA: Diagnosis not present

## 2023-03-09 DIAGNOSIS — Z6838 Body mass index (BMI) 38.0-38.9, adult: Secondary | ICD-10-CM | POA: Diagnosis not present

## 2023-03-09 DIAGNOSIS — Z96652 Presence of left artificial knee joint: Secondary | ICD-10-CM | POA: Diagnosis not present

## 2023-03-09 DIAGNOSIS — Z8616 Personal history of COVID-19: Secondary | ICD-10-CM | POA: Diagnosis not present

## 2023-03-09 DIAGNOSIS — Z7982 Long term (current) use of aspirin: Secondary | ICD-10-CM | POA: Diagnosis not present

## 2023-03-10 ENCOUNTER — Telehealth: Payer: Self-pay | Admitting: Orthopaedic Surgery

## 2023-03-10 ENCOUNTER — Other Ambulatory Visit: Payer: Self-pay | Admitting: Orthopaedic Surgery

## 2023-03-10 ENCOUNTER — Ambulatory Visit: Payer: BC Managed Care – PPO | Admitting: Orthopaedic Surgery

## 2023-03-10 ENCOUNTER — Encounter: Payer: Self-pay | Admitting: Orthopaedic Surgery

## 2023-03-10 ENCOUNTER — Other Ambulatory Visit: Payer: Self-pay

## 2023-03-10 DIAGNOSIS — Z96652 Presence of left artificial knee joint: Secondary | ICD-10-CM

## 2023-03-10 MED ORDER — OXYCODONE HCL 5 MG PO TABS: 5.0000 mg | ORAL_TABLET | ORAL | 0 refills | Status: DC | PRN

## 2023-03-10 NOTE — Telephone Encounter (Signed)
 Rx refill  Oxycodone   Pharmacy: [facility] [address]

## 2023-03-10 NOTE — Progress Notes (Signed)
 The patient is here today for first postoperative visit status post a left total knee arthroplasty.  She is only 63 years old.  She has both of her hips replaced and does eventually need revising.  We have replaced her right knee and she said the left knee has hurt her worse in the postoperative period however she has made good progress with getting the knee moving.  She is ambulating appropriately with a rolling walker today.  She has been compliant with a baby aspirin  twice a day as well as wearing compressive hose.  Her left knee is examined today.  Her calf is soft.  Her extension is full and her flexion is to just past 90 degrees.  The staples been removed and Steri-Strips applied.  Her calf is soft.  She is able to flex and extend at the ankle.  We will set her up for outpatient physical therapy upstairs status post a left total knee arthroplasty.  Will see her back in 4 weeks to see how she is doing overall.  I did [facility] in some more [facility].

## 2023-03-10 NOTE — Telephone Encounter (Signed)
 Patient called asked if the Rx for Oxycodone  can [facility] [address].   [facility] on [address] do not have the medication. The number to contact patient is [phone]

## 2023-03-10 NOTE — Telephone Encounter (Signed)
 Patient would like to change her PT location to [place] instead.

## 2023-03-21 ENCOUNTER — Telehealth: Payer: Self-pay | Admitting: Orthopaedic Surgery

## 2023-03-21 ENCOUNTER — Other Ambulatory Visit: Payer: Self-pay | Admitting: Orthopaedic Surgery

## 2023-03-21 MED ORDER — OXYCODONE HCL 5 MG PO TABS: 5.0000 mg | ORAL_TABLET | Freq: Four times a day (QID) | ORAL | 0 refills | Status: DC | PRN

## 2023-03-21 MED ORDER — METHOCARBAMOL 500 MG PO TABS: 500.0000 mg | ORAL_TABLET | Freq: Four times a day (QID) | ORAL | 1 refills | Status: AC | PRN

## 2023-03-21 NOTE — Telephone Encounter (Signed)
 Patient called needing Rx refilled Oxycodone and Methocarbamol. [facility] [address]. Patient said she is doing quite well. The number to contact patient is [phone]

## 2023-03-22 DIAGNOSIS — M25662 Stiffness of left knee, not elsewhere classified: Secondary | ICD-10-CM | POA: Diagnosis not present

## 2023-03-22 DIAGNOSIS — R269 Unspecified abnormalities of gait and mobility: Secondary | ICD-10-CM | POA: Diagnosis not present

## 2023-03-22 DIAGNOSIS — M13862 Other specified arthritis, left knee: Secondary | ICD-10-CM | POA: Diagnosis not present

## 2023-03-22 DIAGNOSIS — R531 Weakness: Secondary | ICD-10-CM | POA: Diagnosis not present

## 2023-03-24 DIAGNOSIS — M13862 Other specified arthritis, left knee: Secondary | ICD-10-CM | POA: Diagnosis not present

## 2023-03-24 DIAGNOSIS — R269 Unspecified abnormalities of gait and mobility: Secondary | ICD-10-CM | POA: Diagnosis not present

## 2023-03-24 DIAGNOSIS — M25662 Stiffness of left knee, not elsewhere classified: Secondary | ICD-10-CM | POA: Diagnosis not present

## 2023-03-24 DIAGNOSIS — R531 Weakness: Secondary | ICD-10-CM | POA: Diagnosis not present

## 2023-03-29 DIAGNOSIS — R531 Weakness: Secondary | ICD-10-CM | POA: Diagnosis not present

## 2023-03-29 DIAGNOSIS — M25662 Stiffness of left knee, not elsewhere classified: Secondary | ICD-10-CM | POA: Diagnosis not present

## 2023-03-29 DIAGNOSIS — M13862 Other specified arthritis, left knee: Secondary | ICD-10-CM | POA: Diagnosis not present

## 2023-03-29 DIAGNOSIS — R269 Unspecified abnormalities of gait and mobility: Secondary | ICD-10-CM | POA: Diagnosis not present

## 2023-03-30 ENCOUNTER — Telehealth: Payer: Self-pay | Admitting: Orthopaedic Surgery

## 2023-03-30 NOTE — Telephone Encounter (Signed)
 Patient called advised she has not had any pain medication in 3 1/2 days and wanted to know can she drive. Patient also said her (PT) asked when will she be allowed to use the pool? The number to contact patient is [phone]

## 2023-03-30 NOTE — Telephone Encounter (Signed)
 Patient aware of the below message

## 2023-04-01 DIAGNOSIS — M13862 Other specified arthritis, left knee: Secondary | ICD-10-CM | POA: Diagnosis not present

## 2023-04-01 DIAGNOSIS — R269 Unspecified abnormalities of gait and mobility: Secondary | ICD-10-CM | POA: Diagnosis not present

## 2023-04-01 DIAGNOSIS — R531 Weakness: Secondary | ICD-10-CM | POA: Diagnosis not present

## 2023-04-01 DIAGNOSIS — M25662 Stiffness of left knee, not elsewhere classified: Secondary | ICD-10-CM | POA: Diagnosis not present

## 2023-04-04 DIAGNOSIS — M13862 Other specified arthritis, left knee: Secondary | ICD-10-CM | POA: Diagnosis not present

## 2023-04-04 DIAGNOSIS — R531 Weakness: Secondary | ICD-10-CM | POA: Diagnosis not present

## 2023-04-04 DIAGNOSIS — M25662 Stiffness of left knee, not elsewhere classified: Secondary | ICD-10-CM | POA: Diagnosis not present

## 2023-04-04 DIAGNOSIS — R269 Unspecified abnormalities of gait and mobility: Secondary | ICD-10-CM | POA: Diagnosis not present

## 2023-04-07 ENCOUNTER — Ambulatory Visit: Payer: BC Managed Care – PPO | Admitting: Orthopaedic Surgery

## 2023-04-07 ENCOUNTER — Encounter: Payer: Self-pay | Admitting: Orthopaedic Surgery

## 2023-04-07 DIAGNOSIS — Z96652 Presence of left artificial knee joint: Secondary | ICD-10-CM

## 2023-04-07 NOTE — Progress Notes (Signed)
 The patient is a 63 year old female who is now 6 weeks status post a left knee replacement.  She has been working with physical therapy for her [facility] physical therapy and making great progress.  She actually is ready to return to work by the end of next week.  She is going to continue in physical therapy and start aquatic therapy as well.  She is ambulate with a cane.  She reports good range of motion and strength.  On exam her extension is full and her flexion is almost full of her left knee and that knee feels ligamentously stable.  She will continue to increase her activities as comfort allows.  I will see her back in 6 weeks and we will have a standing AP and lateral of her right operative knee at that visit.

## 2023-04-11 DIAGNOSIS — M25662 Stiffness of left knee, not elsewhere classified: Secondary | ICD-10-CM | POA: Diagnosis not present

## 2023-04-11 DIAGNOSIS — R531 Weakness: Secondary | ICD-10-CM | POA: Diagnosis not present

## 2023-04-11 DIAGNOSIS — M13862 Other specified arthritis, left knee: Secondary | ICD-10-CM | POA: Diagnosis not present

## 2023-04-11 DIAGNOSIS — R269 Unspecified abnormalities of gait and mobility: Secondary | ICD-10-CM | POA: Diagnosis not present

## 2023-04-13 DIAGNOSIS — R269 Unspecified abnormalities of gait and mobility: Secondary | ICD-10-CM | POA: Diagnosis not present

## 2023-04-13 DIAGNOSIS — M25662 Stiffness of left knee, not elsewhere classified: Secondary | ICD-10-CM | POA: Diagnosis not present

## 2023-04-13 DIAGNOSIS — R531 Weakness: Secondary | ICD-10-CM | POA: Diagnosis not present

## 2023-04-13 DIAGNOSIS — M13862 Other specified arthritis, left knee: Secondary | ICD-10-CM | POA: Diagnosis not present

## 2023-04-20 DIAGNOSIS — M25662 Stiffness of left knee, not elsewhere classified: Secondary | ICD-10-CM | POA: Diagnosis not present

## 2023-04-20 DIAGNOSIS — M13862 Other specified arthritis, left knee: Secondary | ICD-10-CM | POA: Diagnosis not present

## 2023-04-20 DIAGNOSIS — R269 Unspecified abnormalities of gait and mobility: Secondary | ICD-10-CM | POA: Diagnosis not present

## 2023-04-20 DIAGNOSIS — R531 Weakness: Secondary | ICD-10-CM | POA: Diagnosis not present

## 2023-04-27 DIAGNOSIS — R531 Weakness: Secondary | ICD-10-CM | POA: Diagnosis not present

## 2023-04-27 DIAGNOSIS — M25662 Stiffness of left knee, not elsewhere classified: Secondary | ICD-10-CM | POA: Diagnosis not present

## 2023-04-27 DIAGNOSIS — R269 Unspecified abnormalities of gait and mobility: Secondary | ICD-10-CM | POA: Diagnosis not present

## 2023-04-27 DIAGNOSIS — M13862 Other specified arthritis, left knee: Secondary | ICD-10-CM | POA: Diagnosis not present

## 2023-05-04 DIAGNOSIS — M13862 Other specified arthritis, left knee: Secondary | ICD-10-CM | POA: Diagnosis not present

## 2023-05-04 DIAGNOSIS — M25662 Stiffness of left knee, not elsewhere classified: Secondary | ICD-10-CM | POA: Diagnosis not present

## 2023-05-04 DIAGNOSIS — R269 Unspecified abnormalities of gait and mobility: Secondary | ICD-10-CM | POA: Diagnosis not present

## 2023-05-04 DIAGNOSIS — R531 Weakness: Secondary | ICD-10-CM | POA: Diagnosis not present

## 2023-05-11 DIAGNOSIS — R531 Weakness: Secondary | ICD-10-CM | POA: Diagnosis not present

## 2023-05-11 DIAGNOSIS — M13862 Other specified arthritis, left knee: Secondary | ICD-10-CM | POA: Diagnosis not present

## 2023-05-11 DIAGNOSIS — R269 Unspecified abnormalities of gait and mobility: Secondary | ICD-10-CM | POA: Diagnosis not present

## 2023-05-11 DIAGNOSIS — M25662 Stiffness of left knee, not elsewhere classified: Secondary | ICD-10-CM | POA: Diagnosis not present

## 2023-05-18 DIAGNOSIS — R269 Unspecified abnormalities of gait and mobility: Secondary | ICD-10-CM | POA: Diagnosis not present

## 2023-05-18 DIAGNOSIS — R531 Weakness: Secondary | ICD-10-CM | POA: Diagnosis not present

## 2023-05-18 DIAGNOSIS — M25662 Stiffness of left knee, not elsewhere classified: Secondary | ICD-10-CM | POA: Diagnosis not present

## 2023-05-18 DIAGNOSIS — M13862 Other specified arthritis, left knee: Secondary | ICD-10-CM | POA: Diagnosis not present

## 2023-05-19 ENCOUNTER — Other Ambulatory Visit (INDEPENDENT_AMBULATORY_CARE_PROVIDER_SITE_OTHER): Payer: Self-pay

## 2023-05-19 ENCOUNTER — Encounter: Payer: Self-pay | Admitting: Orthopaedic Surgery

## 2023-05-19 ENCOUNTER — Ambulatory Visit (INDEPENDENT_AMBULATORY_CARE_PROVIDER_SITE_OTHER): Admitting: Orthopaedic Surgery

## 2023-05-19 DIAGNOSIS — Z96652 Presence of left artificial knee joint: Secondary | ICD-10-CM

## 2023-05-19 NOTE — Progress Notes (Signed)
 The patient is here today at exactly 3 months status post a left total knee arthroplasty.  We actually replaced her right knee secondary to arthritis back in February 2023.  She has a remote history of both of her hips being replaced elsewhere.  Her right knee is a cemented knee and her left knee is a press-fit knee.  She has had a little more pain with the left knee but I do feel this is related to this being a press-fit implant.  She is walking without assist device.  Most of her limp is related to her old hip replacements.  Her therapist is recommending a knee sleeve just to help with swelling I think this is a good idea.  She is having a hard time finding one to fit correctly.  Her left knee has excellent range of motion.  There is still some swelling to be expected but it feels ligamentously stable.  Her right knee also moves well.  X-rays of the left knee today show well-seated press-fit total knee arthroplasty with no complicating features.  From my standpoint the next time we will see her in 6 months.  Will have an AP and lateral standing of the left knee at that visit since it is press-fit implants.  I did give her reassurance that this should improve with time in terms of the pain standpoint of things.

## 2023-06-10 DIAGNOSIS — R946 Abnormal results of thyroid function studies: Secondary | ICD-10-CM | POA: Diagnosis not present

## 2023-06-10 DIAGNOSIS — Z1212 Encounter for screening for malignant neoplasm of rectum: Secondary | ICD-10-CM | POA: Diagnosis not present

## 2023-06-10 DIAGNOSIS — E559 Vitamin D deficiency, unspecified: Secondary | ICD-10-CM | POA: Diagnosis not present

## 2023-06-10 DIAGNOSIS — E7800 Pure hypercholesterolemia, unspecified: Secondary | ICD-10-CM | POA: Diagnosis not present

## 2023-06-13 DIAGNOSIS — Z1331 Encounter for screening for depression: Secondary | ICD-10-CM | POA: Diagnosis not present

## 2023-06-13 DIAGNOSIS — R82998 Other abnormal findings in urine: Secondary | ICD-10-CM | POA: Diagnosis not present

## 2023-06-13 DIAGNOSIS — I10 Essential (primary) hypertension: Secondary | ICD-10-CM | POA: Diagnosis not present

## 2023-06-13 DIAGNOSIS — Z0000 Encounter for general adult medical examination without abnormal findings: Secondary | ICD-10-CM | POA: Diagnosis not present

## 2023-06-13 DIAGNOSIS — Z1339 Encounter for screening examination for other mental health and behavioral disorders: Secondary | ICD-10-CM | POA: Diagnosis not present

## 2023-10-31 ENCOUNTER — Ambulatory Visit: Admitting: Orthopedic Surgery

## 2023-10-31 DIAGNOSIS — M1812 Unilateral primary osteoarthritis of first carpometacarpal joint, left hand: Secondary | ICD-10-CM

## 2023-10-31 NOTE — Progress Notes (Signed)
 Rosemarie Galvis - 63 y.o. female MRN [id number]  Date of birth: 02/17/1960  Office Visit Note: Visit Date: 10/31/2023 PCP: Tisovec, Richard W, MD Referred by: Vernadine Charlie ORN, MD  Subjective: No chief complaint on file.  HPI: Nataly Pacifico is a pleasant 63 y.o. female who returns today for evaluation of ongoing pain at the basilar aspect of the left thumb that is been present now for multiple years. She has undergone prior injection to the left thumb CMC articulation in April of this year with minimal relief.  She has also done extensive bracing without lasting relief.  She has plans for upcoming total knee arthroplasty performed by Dr. Vernetta in January of this coming year.  Pertinent ROS were reviewed with the patient and found to be negative unless otherwise specified above in HPI.   Visit Reason: left thumb CMC Duration of symptoms: years Hand dominance: right Occupation: Forensic scientist Diabetic: No Smoking: No Heart/Lung History: none Blood Thinners:  none  Prior Testing/EMG: xrays 05/31/22 Injections (Date): April 2024 Treatments: injection, bracing Prior Surgery:none  Assessment & Plan: Visit Diagnoses:  1. Arthritis of carpometacarpal (CMC) joint of left thumb     Plan: Extensive discussion was had with the patient today regarding her ongoing left thumb CMC arthritis.  X-rays were reviewed in detail today which are consistent with her clinical examination, she does have significant degenerative change at the left thumb CMC interval.  We discussed a range of treatment options ranging from conservative to surgical.  From a conservative standpoint, we discussed activity modification, bracing, injections, anti-inflammatory medications as well as topical remedies.  We did discuss that her symptoms have become recalcitrant to the prior injection performed in April of this year.  At this juncture, given that her symptoms are refractory conservative care in the form of  activity modification, injections, anti-inflammatory medication, she is indicated for left thumb CMC arthroplasty.  We discussed the surgical technique of the [facility] arthroplasty as well as the postoperative protocol in detail today.  Risks and benefits of the procedure were discussed, risks including but not limited to infection, bleeding, scarring, stiffness, nerve injury, tendon injury, vascular injury, hardware complication if utilized, recurrence of symptoms and need for subsequent operation.  Patient expressed understanding and we will move forward with surgical scheduling.  She would like her surgery performed in the December timeframe.  Follow-up: No follow-ups on file.   Meds & Orders: No orders of the defined types were placed in this encounter.  No orders of the defined types were placed in this encounter.    Procedures: No procedures performed      Clinical History: No specialty comments available.  She reports that she has never smoked. She does not have any smokeless tobacco history on file. No results for input(s): HGBA1C, LABURIC in the last 8760 hours.  Objective:   Vital Signs: There were no vitals taken for this visit.  Physical Exam  Gen: Well-appearing, in no acute distress; non-toxic CV: Regular Rate. Well-perfused. Warm.  Resp: Breathing unlabored on room air; no wheezing. Psych: Fluid speech in conversation; appropriate affect; normal thought process  Ortho Exam General: Patient is well appearing and in no distress.    Skin and Muscle: No skin changes are apparent to upper extremities.  Muscle bulk and contour normal, no signs of atrophy.      Range of Motion and Palpation Tests: Mobility is full about the elbows with flexion and extension.  Forearm supination and pronation are  85/85 bilaterally.  Wrist flexion/extension is 75/65 bilaterally.  Digital flexion and extension are full.  Thumb opposition is full to the base of the small fingers bilaterally.      No cords or nodules are palpated.  No triggering is observed.     Significant tenderness over the left thumb CMC articulation is observed, positive grind, positive crepitus.  MP hyperextension minimal, less than 10 degrees.    Finklestein test mildly positive left side   Neurologic, Vascular, Motor: Sensation is intact to light touch in the median/radial/ulnar distributions.  Tinel's testing negative at wrist level. Phalen's negative bilaterally, Derkan's compression bilaterally.  Fingers pink and well perfused.  Capillary refill is brisk.     Imaging: Prior hand x-rays from April of this year were reviewed which do show significant basilar thumb joint arthritis  Past Medical/Family/Surgical/Social History: Medications & Allergies reviewed per EMR, new medications updated. Patient Active Problem List   Diagnosis Date Noted   Status post total left knee replacement 02/25/2023   Status post total right knee replacement 03/27/2021   COVID-19 virus infection 06/09/2019   Acute respiratory failure with hypoxia (HCC) 06/09/2019   Past Medical History:  Diagnosis Date   Arthritis    Hypertension    Family History  Problem Relation Age of Onset   Hypertension Mother    CAD Mother    Hypertension Father    CAD Father    Diabetes Brother    Past Surgical History:  Procedure Laterality Date   ABDOMINAL HYSTERECTOMY     ANKLE SURGERY Left    BACK SURGERY     lumbar fusuion   JOINT REPLACEMENT     Bilateral hip replacements.   SPINAL FUSION     STRABISMUS SURGERY Bilateral    TOTAL HIP ARTHROPLASTY Bilateral    TOTAL KNEE ARTHROPLASTY Right 03/27/2021   Procedure: Right TOTAL KNEE ARTHROPLASTY;  Surgeon: Vernetta Lonni GRADE, MD;  Location: [facility];  Service: Orthopedics;  Laterality: Right;   TOTAL KNEE ARTHROPLASTY Left 02/25/2023   Procedure: LEFT TOTAL KNEE ARTHROPLASTY;  Surgeon: Vernetta Lonni GRADE, MD;  Location: [facility];  Service: Orthopedics;  Laterality: Left;    WISDOM TOOTH EXTRACTION     Social History   Occupational History   Not on file  Tobacco Use   Smoking status: Never   Smokeless tobacco: Not on file  Vaping Use   Vaping status: Never Used  Substance and Sexual Activity   Alcohol  use: Never   Drug use: Never   Sexual activity: Not on file    Jaun Galluzzo Afton Alderton, M.D. [facility]

## 2023-11-07 ENCOUNTER — Ambulatory Visit: Admitting: Orthopedic Surgery

## 2023-11-10 ENCOUNTER — Other Ambulatory Visit: Payer: Self-pay

## 2023-11-10 DIAGNOSIS — M1812 Unilateral primary osteoarthritis of first carpometacarpal joint, left hand: Secondary | ICD-10-CM

## 2023-11-17 ENCOUNTER — Ambulatory Visit: Admitting: Orthopaedic Surgery

## 2023-11-21 ENCOUNTER — Ambulatory Visit: Admitting: Orthopaedic Surgery

## 2023-12-05 ENCOUNTER — Encounter: Payer: Self-pay | Admitting: Radiology

## 2023-12-07 ENCOUNTER — Other Ambulatory Visit (INDEPENDENT_AMBULATORY_CARE_PROVIDER_SITE_OTHER): Payer: Self-pay

## 2023-12-07 ENCOUNTER — Ambulatory Visit: Admitting: Orthopaedic Surgery

## 2023-12-07 DIAGNOSIS — Z96652 Presence of left artificial knee joint: Secondary | ICD-10-CM

## 2023-12-07 NOTE — Progress Notes (Signed)
 The patient is very well-known to us .  We replaced both of her knees.  Her most recent knee was replaced this year in January with a press-fit total knee arthroplasty.  The right knee was replaced in February 2023 with a cemented knee.  She said it did take longer for her to recover from her press-fit knee.  However she does state that both knees are doing well and she is work on range of motion and strength.  On examination both knees have good range of motion and are ligamentously stable.  Standing AP and lateral of the left recent knee shows a well-seated press-fit total knee arthroplasty with no complicating features.  At this point follow-up for her knees can be as needed.  If he does develop any issues with her knees she should let us  know.  She does have a remote history of both her hips being replaced and those hips may need to be addressed at some point in terms of polyethylene liner wear.  These were done elsewhere and the revision would be extensive if it needs that at some point.

## 2023-12-26 ENCOUNTER — Telehealth: Payer: Self-pay | Admitting: Orthopedic Surgery

## 2023-12-26 NOTE — Telephone Encounter (Signed)
 FMLA forms faxed to new fax number provided by the patient [phone].

## 2024-01-13 ENCOUNTER — Encounter (HOSPITAL_BASED_OUTPATIENT_CLINIC_OR_DEPARTMENT_OTHER): Payer: Self-pay | Admitting: Orthopedic Surgery

## 2024-01-13 ENCOUNTER — Other Ambulatory Visit: Payer: Self-pay

## 2024-01-17 ENCOUNTER — Telehealth: Payer: Self-pay | Admitting: Orthopedic Surgery

## 2024-01-17 NOTE — Telephone Encounter (Signed)
 Patient left a message requesting if there will be any driving restrictions post op? Patient lives alone and will need to make arrangements if she will not be able to drive for awhile after surgery. Patient states you can leave her a message in regards to this on her voicemail if she is unable to answer when you call, or you can send her a My Chart message if you prefer.

## 2024-01-20 ENCOUNTER — Ambulatory Visit (HOSPITAL_BASED_OUTPATIENT_CLINIC_OR_DEPARTMENT_OTHER)
Admission: RE | Admit: 2024-01-20 | Discharge: 2024-01-20 | Disposition: A | Discharge status: Home / Self Care | Attending: Orthopedic Surgery | Admitting: Orthopedic Surgery

## 2024-01-20 ENCOUNTER — Encounter (HOSPITAL_BASED_OUTPATIENT_CLINIC_OR_DEPARTMENT_OTHER): Payer: Self-pay | Admitting: Orthopedic Surgery

## 2024-01-20 ENCOUNTER — Encounter (HOSPITAL_BASED_OUTPATIENT_CLINIC_OR_DEPARTMENT_OTHER): Payer: Self-pay | Admitting: Anesthesiology

## 2024-01-20 ENCOUNTER — Encounter (HOSPITAL_BASED_OUTPATIENT_CLINIC_OR_DEPARTMENT_OTHER): Admission: RE | Payer: Self-pay

## 2024-01-20 ENCOUNTER — Other Ambulatory Visit: Payer: Self-pay

## 2024-01-20 DIAGNOSIS — Z01818 Encounter for other preprocedural examination: Secondary | ICD-10-CM

## 2024-01-20 SURGERY — ARTHROPLASTY, FINGER
Anesthesia: Regional | Site: Finger | Laterality: Left

## 2024-01-20 MED ORDER — CEFAZOLIN SODIUM-DEXTROSE 2-4 GM/100ML-% IV SOLN: 2.0000 g | INTRAVENOUS | Status: DC

## 2024-01-20 MED ORDER — FENTANYL CITRATE (PF) 100 MCG/2ML IJ SOLN
INTRAMUSCULAR | Status: AC
  Filled 2024-01-20: qty 2

## 2024-01-20 MED ORDER — ACETAMINOPHEN 500 MG PO TABS
ORAL_TABLET | ORAL | Status: AC
  Filled 2024-01-20: qty 2

## 2024-01-20 MED ORDER — CEFAZOLIN SODIUM-DEXTROSE 2-4 GM/100ML-% IV SOLN
INTRAVENOUS | Status: AC
  Filled 2024-01-20: qty 100

## 2024-01-20 MED ORDER — MIDAZOLAM HCL 2 MG/2ML IJ SOLN
INTRAMUSCULAR | Status: AC
  Filled 2024-01-20: qty 2

## 2024-01-20 MED ORDER — LACTATED RINGERS IV SOLN: INTRAVENOUS | Status: DC

## 2024-01-20 MED ORDER — ACETAMINOPHEN 500 MG PO TABS: 1000.0000 mg | ORAL_TABLET | Freq: Once | ORAL | Status: DC

## 2024-01-20 NOTE — Progress Notes (Signed)
 Pt ate a pastry bar this morning at 0715.  Dr. Agarwala made aware and canceled surgery for today.  Pt informed and verbalized understanding.

## 2024-01-20 NOTE — Anesthesia Preprocedure Evaluation (Addendum)
"                                    Anesthesia Evaluation    Reviewed: Allergy & Precautions, Patient's Chart, lab work & pertinent test results  History of Anesthesia Complications Negative for: history of anesthetic complications  Airway        Dental   Pulmonary neg pulmonary ROS          Cardiovascular hypertension, Pt. on medications      Neuro/Psych negative neurological ROS  negative psych ROS   GI/Hepatic negative GI ROS, Neg liver ROS,,,  Endo/Other   Obesity   Renal/GU negative Renal ROS     Musculoskeletal  (+) Arthritis ,    Abdominal   Peds  Hematology negative hematology ROS (+)   Anesthesia Other Findings   Reproductive/Obstetrics                              Anesthesia Physical Anesthesia Plan  ASA: 2  Anesthesia Plan: Regional   Post-op Pain Management: Regional block* and Tylenol  PO (pre-op)*   Induction:   PONV Risk Score and Plan: 2 and Propofol  infusion and Treatment may vary due to age or medical condition  Airway Management Planned: Simple Face Mask and Natural Airway  Additional Equipment: None  Intra-op Plan:   Post-operative Plan:   Informed Consent:   Plan Discussed with: CRNA and Anesthesiologist  Anesthesia Plan Comments: (Cancelled by surgeon due to inadequate NPO status)         Anesthesia Quick Evaluation  "

## 2024-01-24 ENCOUNTER — Ambulatory Visit (HOSPITAL_BASED_OUTPATIENT_CLINIC_OR_DEPARTMENT_OTHER)
Admission: RE | Admit: 2024-01-24 | Discharge: 2024-01-24 | Disposition: A | Discharge status: Home / Self Care | Source: Skilled Nursing Facility | Attending: Orthopedic Surgery | Admitting: Orthopedic Surgery

## 2024-01-24 ENCOUNTER — Other Ambulatory Visit: Payer: Self-pay

## 2024-01-24 ENCOUNTER — Encounter (HOSPITAL_BASED_OUTPATIENT_CLINIC_OR_DEPARTMENT_OTHER): Admission: RE | Disposition: A | Payer: Self-pay | Source: Skilled Nursing Facility | Attending: Orthopedic Surgery

## 2024-01-24 ENCOUNTER — Encounter (HOSPITAL_BASED_OUTPATIENT_CLINIC_OR_DEPARTMENT_OTHER): Payer: Self-pay | Admitting: Orthopedic Surgery

## 2024-01-24 ENCOUNTER — Ambulatory Visit (HOSPITAL_BASED_OUTPATIENT_CLINIC_OR_DEPARTMENT_OTHER)

## 2024-01-24 DIAGNOSIS — M1812 Unilateral primary osteoarthritis of first carpometacarpal joint, left hand: Secondary | ICD-10-CM | POA: Diagnosis present

## 2024-01-24 DIAGNOSIS — Z01818 Encounter for other preprocedural examination: Secondary | ICD-10-CM

## 2024-01-24 DIAGNOSIS — Z01810 Encounter for preprocedural cardiovascular examination: Secondary | ICD-10-CM

## 2024-01-24 DIAGNOSIS — Z79899 Other long term (current) drug therapy: Secondary | ICD-10-CM | POA: Insufficient documentation

## 2024-01-24 DIAGNOSIS — I10 Essential (primary) hypertension: Secondary | ICD-10-CM | POA: Insufficient documentation

## 2024-01-24 HISTORY — PX: FINGER ARTHROPLASTY: SHX5017

## 2024-01-24 SURGERY — ARTHROPLASTY, FINGER
Anesthesia: Monitor Anesthesia Care | Site: Finger | Laterality: Left

## 2024-01-24 MED ORDER — FENTANYL CITRATE (PF) 100 MCG/2ML IJ SOLN
50.0000 ug | Freq: Once | INTRAMUSCULAR | Status: AC
  Administered 2024-01-24: 50 ug via INTRAVENOUS

## 2024-01-24 MED ORDER — OXYCODONE HCL 5 MG/5ML PO SOLN: 5.0000 mg | Freq: Once | ORAL | Status: DC | PRN

## 2024-01-24 MED ORDER — MIDAZOLAM HCL 2 MG/2ML IJ SOLN
INTRAMUSCULAR | Status: AC
  Filled 2024-01-24: qty 2

## 2024-01-24 MED ORDER — ONDANSETRON HCL 4 MG/2ML IJ SOLN
INTRAMUSCULAR | Status: DC | PRN
  Administered 2024-01-24: 4 mg via INTRAVENOUS

## 2024-01-24 MED ORDER — KETOROLAC TROMETHAMINE 30 MG/ML IJ SOLN
INTRAMUSCULAR | Status: AC
  Filled 2024-01-24: qty 1

## 2024-01-24 MED ORDER — ACETAMINOPHEN 10 MG/ML IV SOLN: 1000.0000 mg | Freq: Once | INTRAVENOUS | Status: DC | PRN

## 2024-01-24 MED ORDER — PROPOFOL 500 MG/50ML IV EMUL
INTRAVENOUS | Status: AC
  Filled 2024-01-24: qty 100

## 2024-01-24 MED ORDER — ONDANSETRON HCL 4 MG/2ML IJ SOLN
INTRAMUSCULAR | Status: AC
  Filled 2024-01-24: qty 2

## 2024-01-24 MED ORDER — FENTANYL CITRATE (PF) 100 MCG/2ML IJ SOLN
INTRAMUSCULAR | Status: AC
  Filled 2024-01-24: qty 2

## 2024-01-24 MED ORDER — MIDAZOLAM HCL (PF) 2 MG/2ML IJ SOLN
2.0000 mg | Freq: Once | INTRAMUSCULAR | Status: AC
  Administered 2024-01-24: 2 mg via INTRAVENOUS

## 2024-01-24 MED ORDER — PROPOFOL 10 MG/ML IV BOLUS
INTRAVENOUS | Status: DC | PRN
  Administered 2024-01-24 (×2): 30 mg via INTRAVENOUS

## 2024-01-24 MED ORDER — PROPOFOL 500 MG/50ML IV EMUL
INTRAVENOUS | Status: DC | PRN
  Administered 2024-01-24: 75 ug/kg/min via INTRAVENOUS
  Administered 2024-01-24 (×2): 100 ug/kg/min via INTRAVENOUS

## 2024-01-24 MED ORDER — LACTATED RINGERS IV SOLN: INTRAVENOUS | Status: DC

## 2024-01-24 MED ORDER — OXYCODONE HCL 5 MG PO TABS: 5.0000 mg | ORAL_TABLET | Freq: Once | ORAL | Status: DC | PRN

## 2024-01-24 MED ORDER — 0.9 % SODIUM CHLORIDE (POUR BTL) OPTIME
TOPICAL | Status: DC | PRN
  Administered 2024-01-24: 400 mL

## 2024-01-24 MED ORDER — CEFAZOLIN SODIUM-DEXTROSE 2-4 GM/100ML-% IV SOLN
INTRAVENOUS | Status: AC
  Filled 2024-01-24: qty 100

## 2024-01-24 MED ORDER — FENTANYL CITRATE (PF) 100 MCG/2ML IJ SOLN: 25.0000 ug | INTRAMUSCULAR | Status: DC | PRN

## 2024-01-24 MED ORDER — ROPIVACAINE HCL 5 MG/ML IJ SOLN
INTRAMUSCULAR | Status: DC | PRN
  Administered 2024-01-24: 25 mL via PERINEURAL

## 2024-01-24 MED ORDER — DROPERIDOL 2.5 MG/ML IJ SOLN: 0.6250 mg | Freq: Once | INTRAMUSCULAR | Status: DC | PRN

## 2024-01-24 MED ORDER — DEXMEDETOMIDINE HCL IN NACL 80 MCG/20ML IV SOLN
INTRAVENOUS | Status: AC
  Filled 2024-01-24: qty 40

## 2024-01-24 MED ORDER — CEFAZOLIN SODIUM-DEXTROSE 2-4 GM/100ML-% IV SOLN
2.0000 g | INTRAVENOUS | Status: AC
  Administered 2024-01-24: 2 g via INTRAVENOUS

## 2024-01-24 MED ORDER — PHENYLEPHRINE 80 MCG/ML (10ML) SYRINGE FOR IV PUSH (FOR BLOOD PRESSURE SUPPORT)
PREFILLED_SYRINGE | INTRAVENOUS | Status: AC
  Filled 2024-01-24: qty 10

## 2024-01-24 MED ORDER — OXYCODONE HCL 5 MG PO TABS: 5.0000 mg | ORAL_TABLET | Freq: Four times a day (QID) | ORAL | 0 refills | Status: AC | PRN

## 2024-01-24 MED ORDER — PHENYLEPHRINE HCL (PRESSORS) 10 MG/ML IV SOLN
INTRAVENOUS | Status: DC | PRN
  Administered 2024-01-24: 80 ug via INTRAVENOUS

## 2024-01-24 SURGICAL SUPPLY — 54 items
ANCHOR REPAIR HAND WRIST (Anchor) IMPLANT
BLADE SURG 15 STRL LF DISP TIS (BLADE) ×2 IMPLANT
BNDG COHESIVE 4X5 TAN STRL LF (GAUZE/BANDAGES/DRESSINGS) ×1 IMPLANT
BNDG COMPR ESMARK 4X3 LF (GAUZE/BANDAGES/DRESSINGS) ×1 IMPLANT
BNDG ELASTIC 4INX 5YD STR LF (GAUZE/BANDAGES/DRESSINGS) ×2 IMPLANT
BNDG GAUZE DERMACEA FLUFF 4 (GAUZE/BANDAGES/DRESSINGS) ×1 IMPLANT
BUR EGG 3PK/BX (BURR) IMPLANT
BUR OVAL CARBIDE 4.0 (BURR) IMPLANT
BUR PEAR (BURR) IMPLANT
CHLORAPREP W/TINT 26 (MISCELLANEOUS) ×1 IMPLANT
CORD BIPOLAR FORCEPS 12FT (ELECTRODE) ×1 IMPLANT
COVER BACK TABLE 60X90IN (DRAPES) ×1 IMPLANT
CUFF TOURN SGL QUICK 18X4 (TOURNIQUET CUFF) IMPLANT
CUFF TRNQT CYL 24X4X16.5-23 (TOURNIQUET CUFF) ×1 IMPLANT
DRAPE HAND 77X146 (DRAPES) ×1 IMPLANT
DRAPE OEC MINIVIEW 54X84 (DRAPES) ×1 IMPLANT
DRAPE SURG 17X23 STRL (DRAPES) ×1 IMPLANT
GAUZE PAD ABD 8X10 STRL (GAUZE/BANDAGES/DRESSINGS) IMPLANT
GAUZE SPONGE 4X4 12PLY STRL (GAUZE/BANDAGES/DRESSINGS) ×1 IMPLANT
GAUZE XEROFORM 1X8 LF (GAUZE/BANDAGES/DRESSINGS) ×1 IMPLANT
GLOVE BIO SURGEON STRL SZ7.5 (GLOVE) ×2 IMPLANT
GLOVE BIOGEL PI IND STRL 7.5 (GLOVE) ×2 IMPLANT
GOWN STRL REUS W/ TWL LRG LVL3 (GOWN DISPOSABLE) ×1 IMPLANT
GOWN STRL REUS W/TWL XL LVL3 (GOWN DISPOSABLE) ×1 IMPLANT
GOWN STRL SURGICAL XL XLNG (GOWN DISPOSABLE) ×1 IMPLANT
MANIFOLD NEPTUNE II (INSTRUMENTS) ×1 IMPLANT
NDL HYPO 25X1 1.5 SAFETY (NEEDLE) IMPLANT
NDL HYPO 25X5/8 SAFETYGLIDE (NEEDLE) IMPLANT
NDL KEITH (NEEDLE) IMPLANT
NEEDLE HYPO 25X1 1.5 SAFETY (NEEDLE) IMPLANT
NEEDLE HYPO 25X5/8 SAFETYGLIDE (NEEDLE) IMPLANT
NEEDLE KEITH (NEEDLE) IMPLANT
PACK BASIN DAY SURGERY FS (CUSTOM PROCEDURE TRAY) ×1 IMPLANT
PAD CAST 4YDX4 CTTN HI CHSV (CAST SUPPLIES) ×2 IMPLANT
SHEET MEDIUM DRAPE 40X70 STRL (DRAPES) ×1 IMPLANT
SLEEVE SCD COMPRESS KNEE MED (STOCKING) ×1 IMPLANT
SOLN 0.9% NACL POUR BTL 1000ML (IV SOLUTION) IMPLANT
SPIKE FLUID TRANSFER (MISCELLANEOUS) IMPLANT
SPLINT PLASTER CAST XFAST 4X15 (CAST SUPPLIES) ×10 IMPLANT
SPONGE SURGIFOAM ABS GEL 12-7 (HEMOSTASIS) IMPLANT
STOCKINETTE IMPERVIOUS 9X36 MD (GAUZE/BANDAGES/DRESSINGS) ×1 IMPLANT
SUCTION TUBE FRAZIER 10FR DISP (SUCTIONS) IMPLANT
SUT ETHILON 4 0 PS 2 18 (SUTURE) ×1 IMPLANT
SUT MNCRL AB 3-0 PS2 27 (SUTURE) ×1 IMPLANT
SUT MNCRL AB 4-0 PS2 18 (SUTURE) IMPLANT
SUT PDS AB 4-0 RB1 27 (SUTURE) IMPLANT
SUT SILK 4 0 PS 2 (SUTURE) IMPLANT
SUT VIC AB 3-0 FS2 27 (SUTURE) IMPLANT
SUT VIC AB 4-0 PS2 18 (SUTURE) IMPLANT
SYR BULB EAR ULCER 3OZ GRN STR (SYRINGE) ×2 IMPLANT
SYR CONTROL 10ML LL (SYRINGE) IMPLANT
TOWEL GREEN STERILE FF (TOWEL DISPOSABLE) ×2 IMPLANT
TUBE CONNECTING 20X1/4 (TUBING) IMPLANT
UNDERPAD 30X36 HEAVY ABSORB (UNDERPADS AND DIAPERS) ×1 IMPLANT

## 2024-01-24 NOTE — Progress Notes (Signed)
 Assisted Dr. Lyn Henri with left, supraclavicular, ultrasound guided block. Side rails up, monitors on throughout procedure. See vital signs in flow sheet. Tolerated Procedure well.

## 2024-01-24 NOTE — H&P (Signed)
 @LOGODEPT @  Telisa Ohlsen - 63 y.o. female MRN [id number]  Date of birth: Jan 04, 1961   HAND SURGERY H&P UPDATE   HPI: Patient is a 63 y.o. female who presents with left thumb CMC arthritis, here today for left thumb CMC arthroplasty.  Patient denies any changes to their medical history or new systemic symptoms today.    Past Medical History:  Diagnosis Date   Arthritis    Hypertension    Past Surgical History:  Procedure Laterality Date   ABDOMINAL HYSTERECTOMY     ANKLE SURGERY Left    BACK SURGERY     lumbar fusuion   JOINT REPLACEMENT     Bilateral hip replacements.   SPINAL FUSION     STRABISMUS SURGERY Bilateral    TOTAL HIP ARTHROPLASTY Bilateral    TOTAL KNEE ARTHROPLASTY Right 03/27/2021   Procedure: Right TOTAL KNEE ARTHROPLASTY;  Surgeon: Vernetta Lonni GRADE, MD;  Location: [facility];  Service: Orthopedics;  Laterality: Right;   TOTAL KNEE ARTHROPLASTY Left 02/25/2023   Procedure: LEFT TOTAL KNEE ARTHROPLASTY;  Surgeon: Vernetta Lonni GRADE, MD;  Location: [facility];  Service: Orthopedics;  Laterality: Left;   WISDOM TOOTH EXTRACTION     Social History   Socioeconomic History   Marital status: Single    Spouse name: Not on file   Number of children: Not on file   Years of education: Not on file   Highest education level: Not on file  Occupational History   Not on file  Tobacco Use   Smoking status: Never   Smokeless tobacco: Not on file  Vaping Use   Vaping status: Never Used  Substance and Sexual Activity   Alcohol  use: Never   Drug use: Never   Sexual activity: Not Currently    Birth control/protection: Surgical    Comment: hyst  Other Topics Concern   Not on file  Social History Narrative   Not on file   Social Drivers of Health   Tobacco Use: Unknown (01/20/2024)   Patient History    Smoking Tobacco Use: Never    Smokeless Tobacco Use: Unknown    Passive Exposure: Not on file  Financial Resource Strain: Not on file  Food Insecurity: No  Food Insecurity (02/25/2023)   Hunger Vital Sign    Worried About Running Out of Food in the Last Year: Never true    Ran Out of Food in the Last Year: Never true  Transportation Needs: Patient Declined (02/25/2023)   PRAPARE - Administrator, Civil Service (Medical): Patient declined    Lack of Transportation (Non-Medical): Patient declined  Physical Activity: Not on file  Stress: Not on file  Social Connections: Not on file  Depression (PHQ2-9): Not on file  Alcohol  Screen: Not on file  Housing: Unknown (02/25/2023)   Housing Stability Vital Sign    Unable to Pay for Housing in the Last Year: No    Number of Times Moved in the Last Year: Not on file    Homeless in the Last Year: No  Utilities: Not At Risk (02/25/2023)   [employer] Utilities    Threatened with loss of utilities: No  Health Literacy: Not on file   Family History  Problem Relation Age of Onset   Hypertension Mother    CAD Mother    Hypertension Father    CAD Father    Diabetes Brother    - negative except otherwise stated in the family history section Allergies[1] Prior to Admission medications  Medication Sig  Start Date End Date Taking? Authorizing Provider  ascorbic acid  (VITAMIN C ) 500 MG tablet Take 1 tablet (500 mg total) by mouth daily. 06/14/19   Jillian Buttery, MD  calcium  gluconate 500 MG tablet Take 1 tablet by mouth daily. Vit d 600 mg /20 mcg    [provider]  cetirizine (ZYRTEC) 10 MG tablet Take 10 mg by mouth daily.    [provider]  GLUCOSAMINE-CHONDROITIN PO Take 2 tablets by mouth daily. 1500/1200    [provider]  ibuprofen (ADVIL) 200 MG tablet Take 800 mg by mouth daily.    [provider]  Magnesium  250 MG TABS Take 250 mg by mouth daily.    [provider]  methocarbamol  (ROBAXIN ) 500 MG tablet Take 1 tablet (500 mg total) by mouth every 6 (six) hours as needed for muscle spasms. 03/21/23   Vernetta Lonni GRADE, MD  Multiple Vitamin  (MULTIVITAMIN WITH MINERALS) TABS tablet Take 1 tablet by mouth daily. Woman 50+    [provider]  valsartan (DIOVAN) 160 MG tablet Take 160 mg by mouth daily. 07/28/20   [provider]  VITAMIN D  PO Take 5,000 Units by mouth daily.    [provider]  zinc  sulfate 220 (50 Zn) MG capsule Take 1 capsule (220 mg total) by mouth daily. 06/14/19   Jillian Buttery, MD   No results found. - Positive ROS: All other systems have been reviewed and were otherwise negative with the exception of those mentioned in the HPI and as above.    General: Patient is well appearing and in no distress.    Skin and Muscle: No skin changes are apparent to upper extremities.  Muscle bulk and contour normal, no signs of atrophy.      Range of Motion and Palpation Tests: Mobility is full about the elbows with flexion and extension.  Forearm supination and pronation are 85/85 bilaterally.  Wrist flexion/extension is 75/65 bilaterally.  Digital flexion and extension are full.  Thumb opposition is full to the base of the small fingers bilaterally.     No cords or nodules are palpated.  No triggering is observed.     Significant tenderness over the left thumb CMC articulation is observed, positive grind, positive crepitus.  MP hyperextension minimal, less than 10 degrees.    Finklestein test mildly positive left side   Neurologic, Vascular, Motor: Sensation is intact to light touch in the median/radial/ulnar distributions.  Tinel's testing negative at wrist level. Phalen's negative bilaterally, Derkan's compression bilaterally.   Fingers pink and well perfused.  Capillary refill is brisk.     Assessment/Plan: OR today for left thumb CMC arthroplasty. We again reviewed the risks of surgery which include bleeding, infection, damage to neurovascular structures, persistent symptoms, need for additional surgery.  Informed consent was signed.  All questions were answered.   [facility], Hand Surgery      [1]  Allergies Allergen Reactions   Pollen Extract     Other Reaction(s): Unknown   Morphine And Codeine Palpitations and Other (See Comments)    Heart pounds.

## 2024-01-24 NOTE — Discharge Instructions (Addendum)
 Hand Surgery Postop Instructions   Dressings: Maintain postoperative dressing until orthopedic follow-up.  Keep operative site clean and dry until orthopedic follow-up.  Wound Care: Keep your hand elevated above the level of your heart.  Do not allow it to dangle by your side. Moving your fingers is advised to stimulate circulation but will depend on the site of your surgery.  If you have a splint applied, your doctor will advise you regarding movement.  Activity: Do not drive or operate machinery until clearance given from physician. No heavy lifting with operative extremity.  Diet:  Drink liquids today or eat a light diet.  You may resume a regular diet tomorrow.    General expectations: Take prescribed medication if given, transition to over-the-counter medication as quickly as possible. Fingers may become slightly swollen.  Call your doctor if any of the following occur: Severe pain not relieved by pain medication. Elevated temperature. Dressing soaked with blood. Inability to move fingers. White or bluish color to fingers.   Per [facility] clinic policy, our goal is ensure optimal postoperative pain control with a multimodal pain management strategy. For all [facility] patients, our goal is to wean post-operative narcotic medications by 6 weeks post-operatively. If this is not possible due to utilization of pain medication prior to surgery, your [facility] doctor will support your acute post-operative pain control for the first 6 weeks postoperatively, with a plan to transition you back to your primary pain team following that. Cyndia Skeeters will work to ensure a Therapist, occupational.  Naeema Patlan Trevor Mace, M.D. Hand Surgery [facility]  Post Anesthesia Home Care Instructions  Activity: Get plenty of rest for the remainder of the day. A responsible individual must stay with you for 24 hours following the procedure.  For the next 24 hours, DO NOT: -Drive a  car -Advertising copywriter -Drink alcoholic beverages -Take any medication unless instructed by your physician -Make any legal decisions or sign important papers.  Meals: Start with liquid foods such as gelatin or soup. Progress to regular foods as tolerated. Avoid greasy, spicy, heavy foods. If nausea and/or vomiting occur, drink only clear liquids until the nausea and/or vomiting subsides. Call your physician if vomiting continues.  Special Instructions/Symptoms: Your throat may feel dry or sore from the anesthesia or the breathing tube placed in your throat during surgery. If this causes discomfort, gargle with warm salt water. The discomfort should disappear within 24 hours.  If you had a scopolamine patch placed behind your ear for the management of post- operative nausea and/or vomiting:  1. The medication in the patch is effective for 72 hours, after which it should be removed.  Wrap patch in a tissue and discard in the trash. Wash hands thoroughly with soap and water. 2. You may remove the patch earlier than 72 hours if you experience unpleasant side effects which may include dry mouth, dizziness or visual disturbances. 3. Avoid touching the patch. Wash your hands with soap and water after contact with the patch.    Regional Anesthesia Blocks  1. You may not be able to move or feel the "blocked" extremity after a regional anesthetic block. This may last may last from 3-48 hours after placement, but it will go away. The length of time depends on the medication injected and your individual response to the medication. As the nerves start to wake up, you may experience tingling as the movement and feeling returns to your extremity. If the numbness and inability to move your extremity  has not gone away after 48 hours, please call your surgeon.   2. The extremity that is blocked will need to be protected until the numbness is gone and the strength has returned. Because you cannot feel it, you  will need to take extra care to avoid injury. Because it may be weak, you may have difficulty moving it or using it. You may not know what position it is in without looking at it while the block is in effect.  3. For blocks in the legs and feet, returning to weight bearing and walking needs to be done carefully. You will need to wait until the numbness is entirely gone and the strength has returned. You should be able to move your leg and foot normally before you try and bear weight or walk. You will need someone to be with you when you first try to ensure you do not fall and possibly risk injury.  4. Bruising and tenderness at the needle site are common side effects and will resolve in a few days.  5. Persistent numbness or new problems with movement should be communicated to the surgeon or the [facility] [phone] [facility] [phone]).

## 2024-01-24 NOTE — Anesthesia Preprocedure Evaluation (Addendum)
"                                    Anesthesia Evaluation  Patient identified by MRN, date of birth, ID band Patient awake    Reviewed: Allergy & Precautions, H&P , NPO status , Patient's Chart, lab work & pertinent test results  History of Anesthesia Complications Negative for: history of anesthetic complications  Airway Mallampati: II  TM Distance: >3 FB Neck ROM: Full    Dental no notable dental hx.    Pulmonary neg pulmonary ROS, neg sleep apnea   Pulmonary exam normal breath sounds clear to auscultation       Cardiovascular hypertension, Pt. on medications (-) angina (-) Past MI Normal cardiovascular exam Rhythm:Regular Rate:Normal     Neuro/Psych neg Seizures  negative psych ROS   GI/Hepatic negative GI ROS, Neg liver ROS,,,  Endo/Other  negative endocrine ROS   Obesity   Renal/GU negative Renal ROS  negative genitourinary   Musculoskeletal  (+) Arthritis ,  LEFT THUMB CMC ARTHRITIS   Abdominal   Peds negative pediatric ROS (+)  Hematology negative hematology ROS (+)   Anesthesia Other Findings   Reproductive/Obstetrics negative OB ROS                              Anesthesia Physical Anesthesia Plan  ASA: 2  Anesthesia Plan: Regional and MAC   Post-op Pain Management: Regional block*   Induction: Intravenous  PONV Risk Score and Plan: 2 and Propofol  infusion and Treatment may vary due to age or medical condition  Airway Management Planned: Simple Face Mask and Natural Airway  Additional Equipment: None  Intra-op Plan:   Post-operative Plan:   Informed Consent: I have reviewed the patients History and Physical, chart, labs and discussed the procedure including the risks, benefits and alternatives for the proposed anesthesia with the patient or authorized representative who has indicated his/her understanding and acceptance.       Plan Discussed with: CRNA and Anesthesiologist  Anesthesia Plan  Comments: (C)         Anesthesia Quick Evaluation  "

## 2024-01-24 NOTE — Transfer of Care (Signed)
 Immediate Anesthesia Transfer of Care Note  Patient: Tracy Orozco  Procedure(s) Performed: ARTHROPLASTY, FINGER (Left: Finger)  Patient Location: PACU  Anesthesia Type:MAC combined with regional for post-op pain  Level of Consciousness: drowsy  Airway & Oxygen Therapy: Patient Spontanous Breathing and Patient connected to face mask oxygen  Post-op Assessment: Report given to RN and Post -op Vital signs reviewed and stable  Post vital signs: Reviewed and stable  Last Vitals:  Vitals Value Taken Time  BP 113/99 (105)   Temp    Pulse 86 01/24/24 15:46  Resp 21 01/24/24 15:46  SpO2 97 % 01/24/24 15:46  Vitals shown include unfiled device data.  Last Pain:  Vitals:   01/24/24 1304  TempSrc: Temporal  PainSc: 2          Complications: No notable events documented.

## 2024-01-24 NOTE — Anesthesia Procedure Notes (Signed)
 Anesthesia Regional Block: Supraclavicular block   Pre-Anesthetic Checklist: , timeout performed,  Correct Patient, Correct Site, Correct Laterality,  Correct Procedure, Correct Position, site marked,  Risks and benefits discussed,  Surgical consent,  Pre-op evaluation,  At surgeon's request and post-op pain management  Laterality: Left  Prep: chloraprep       Needles:  Injection technique: Single-shot  Needle Type: Echogenic Stimulator Needle     Needle Length: 9cm  Needle Gauge: 21     Additional Needles:   Procedures:,,,, ultrasound used (permanent image in chart),,    Narrative:  Start time: 01/24/2024 1:20 PM End time: 01/24/2024 1:25 PM Injection made incrementally with aspirations every 5 mL.  Performed by: Personally  Anesthesiologist: Erma Thom SAUNDERS, MD  Additional Notes: Discussed risks and benefits of the nerve block in detail, including but not limited vascular injury, permanent nerve damage and infection.   Patient tolerated the procedure well. Local anesthetic introduced in an incremental fashion under minimal resistance after negative aspirations. No paresthesias were elicited. After completion of the procedure, no acute issues were identified and patient continued to be monitored by RN.

## 2024-01-24 NOTE — Op Note (Signed)
 NAME: Tracy Orozco MEDICAL RECORD NO: [id number] DATE OF BIRTH: Jan 09, 1961 FACILITY: [facility] LOCATION: [facility] PHYSICIAN: GILDARDO ALDERTON, MD   OPERATIVE REPORT   DATE OF PROCEDURE: 01/24/2024    PREOPERATIVE DIAGNOSIS: Left thumb carpometacarpal arthritis   POSTOPERATIVE DIAGNOSIS: Left thumb carpometacarpal arthritis   PROCEDURE: Left thumb carpometacarpal arthroplasty, tendon transfer flexor carpi radialis to abductor pollicis longus Left first extensor compartment release Left partial trapezoidectomy   SURGEON:  Gildardo Alderton, M.D.   ASSISTANT: Almeda Rummer, PA   ANESTHESIA:  Regional with sedation   INTRAVENOUS FLUIDS:  Per anesthesia flow sheet.   ESTIMATED BLOOD LOSS:  Minimal.   COMPLICATIONS:  None.   SPECIMENS:  none   TOURNIQUET TIME:    Total Tourniquet Time Documented: Upper Arm (Left) - 42 minutes Total: Upper Arm (Left) - 42 minutes    DISPOSITION:  Stable to PACU.   INDICATIONS: 63 year old female who was seen in the outpatient setting and found to have ongoing left thumb carpometacarpal arthritis that was refractory to conservative care.  Patient was indicated for left thumb CMC arthroplasty.  Risks and benefits of surgery were discussed including the risks of infection, bleeding, scarring, stiffness, nerve injury, vascular injury, tendon injury, need for subsequent operation, thumb subsidence, persistent pain, recurrence.  She voiced understanding of these risks and elected to proceed.  OPERATIVE COURSE: Patient was seen and identified in the preoperative area and marked appropriately.  Surgical consent had been signed. Preoperative IV antibiotic prophylaxis was given. She was transferred to the operating room and placed in supine position with the Left upper extremity on an arm board.  Sedation was induced by the anesthesiologist. A regional block had been performed by anesthesia in preoperative holding.    Left upper extremity was  prepped and draped in normal sterile orthopedic fashion.  A surgical pause was performed between the surgeons, anesthesia, and operating room staff and all were in agreement as to the patient, procedure, and site of procedure.  Tourniquet was placed and padded appropriately to the left upper arm.  The arm was exsanguinated the tourniquet was inflated to 250 mmHg.  A straight line incision was made over the dorsum of the left thumb CMC joint at the glabrous/nonglabrous junction.  Crossing branches of the radial sensory nerve were identified and carefully protected.  The radial artery was identified and protected.  The first extensor compartment was first released.  The tendons of the first extensor compartment were identified within the incisional site.  The dorsal most aspect of the tendon sheath was then released and carried down over the radial styloid.  Following release of the first extensor compartment, CMC arthrotomy was completed.  Thick capsular flaps were elevated both radially and ulnarly.  The trapezium was identified and confirmed with biplanar fluoroscopy.  This was carefully freed and dissected, then removed utilizing a rongeur.  Care was taken to avoid injury to the underlying flexor carpi radialis tendon.  Following complete trapeziectomy, the scaphoid trapezoid joint was inspected.  There was noted to be cartilage loss and degenerative changes at the joint involving the articular surface of the trapezoid, consequently a partial excision of the trapezoid was performed.  At this point, the Arthrex internal brace was utilized.  Base of the index metacarpal was identified utilizing biplanar fluoroscopy, the K wire was inserted into the nonarticular portion followed by drill and the first suture anchor.  Biplanar fluoroscopy was utilized to confirm appropriate positioning of the wire prior to anchor placement.  Stability  was noted to this suture anchor.  Subsequently, the thumb metacarpal base  was identified followed by placement of the additional suture anchor and suspension of the internal brace.  Thumb was held in gently abducted position for internal brace placement.  Biplanar fluoroscopy was utilized to confirm appropriate thumb metacarpal positioning, no significant subsidence was noted with gentle stress testing.  At this stage in the procedure, the FCR tendon was identified and the depth of the wound.  The APL tendons were identified at their distal extent overlying the base the metacarpal.  An 0 FiberWire was utilized to create a tendon transfer between the APL tendon slips and the FCR with traction on the thumb.  Excellent stability was noted.  The void created by the trapeziectomy was filled with a Gelfoam.  The capsular flaps were repaired utilizing 3-0 Vicryl suture in figure-of-eight fashion.  The tourniquet was deflated at 42 minutes.  Fingertips were pink with brisk capillary refill after deflation of tourniquet.  Copious irrigation was performed.  Incision was closed in layers utilizing 3-0 Monocryl for the subcutaneous tissue and a 4-0 running Monocryl for the skin surface.  Dermabond was placed.  Sterile dressings were provided followed by application of a thumb spica splint utilizing plaster.  The operative drapes were broken down.  The patient was awoken from anesthesia safely and taken to PACU in stable condition.  Post-operative plan: The patient will recover in the post-anesthesia care unit and then be discharged home.  The patient will be non weight bearing on the left upper extremity in a thumb spica splint.   I will see the patient back in the office in 2 weeks for postoperative followup.    [name], MD Electronically signed, 01/24/2024

## 2024-01-25 ENCOUNTER — Encounter (HOSPITAL_BASED_OUTPATIENT_CLINIC_OR_DEPARTMENT_OTHER): Payer: Self-pay | Admitting: Orthopedic Surgery

## 2024-01-27 ENCOUNTER — Encounter (HOSPITAL_BASED_OUTPATIENT_CLINIC_OR_DEPARTMENT_OTHER): Payer: Self-pay | Admitting: Orthopedic Surgery

## 2024-01-27 NOTE — Anesthesia Postprocedure Evaluation (Signed)
"   Anesthesia Post Note  Patient: Tracy Orozco  Procedure(s) Performed: ARTHROPLASTY, FINGER (Left: Finger)     Patient location during evaluation: PACU Anesthesia Type: Regional and MAC Level of consciousness: awake and alert Pain management: pain level controlled Vital Signs Assessment: post-procedure vital signs reviewed and stable Respiratory status: spontaneous breathing, nonlabored ventilation, respiratory function stable and patient connected to nasal cannula oxygen Cardiovascular status: stable and blood pressure returned to baseline Postop Assessment: no apparent nausea or vomiting Anesthetic complications: no   No notable events documented.  Last Vitals:  Vitals:   01/24/24 1558 01/24/24 1623  BP:  123/81  Pulse: 81 81  Resp: 16 16  Temp:  (!) 36.4 C  SpO2: 92% 96%    Last Pain:  Vitals:   01/24/24 1623  TempSrc: Temporal  PainSc: 0-No pain                 [name]      "

## 2024-01-31 NOTE — Therapy (Signed)
 " OUTPATIENT OCCUPATIONAL THERAPY ORTHO EVALUATION  Patient Name: Tracy Orozco MRN: [id number] DOB:April 07, 1960, 63 y.o., female Today's Date: 02/06/2024  PCP: Vernadine HAMS MD REFERRING PROVIDER: Arlinda Buster, MD   END OF SESSION:  OT End of Session - 02/06/24 0934     Visit Number 1    Number of Visits 12    Date for Recertification  04/06/24    Authorization Type BCBS    OT Start Time [id number]    OT Stop Time 1015    OT Time Calculation (min) 41 min    Equipment Utilized During Treatment orthotic materials    Activity Tolerance Patient tolerated treatment well;No increased pain;Patient limited by fatigue;Patient limited by pain    Behavior During Therapy [facility] for tasks assessed/performed          Past Medical History:  Diagnosis Date   Arthritis    Hypertension    Past Surgical History:  Procedure Laterality Date   ABDOMINAL HYSTERECTOMY     ANKLE SURGERY Left    BACK SURGERY     lumbar fusuion   FINGER ARTHROPLASTY Left 01/24/2024   Procedure: ARTHROPLASTY, FINGER;  Surgeon: Arlinda Buster, MD;  Location: [facility];  Service: Orthopedics;  Laterality: Left;  LEFT THUMB CMC ARTHROPLASTY WITH INTERNAL BRACE   JOINT REPLACEMENT     Bilateral hip replacements.   SPINAL FUSION     STRABISMUS SURGERY Bilateral    TOTAL HIP ARTHROPLASTY Bilateral    TOTAL KNEE ARTHROPLASTY Right 03/27/2021   Procedure: Right TOTAL KNEE ARTHROPLASTY;  Surgeon: Vernetta Lonni GRADE, MD;  Location: [facility];  Service: Orthopedics;  Laterality: Right;   TOTAL KNEE ARTHROPLASTY Left 02/25/2023   Procedure: LEFT TOTAL KNEE ARTHROPLASTY;  Surgeon: Vernetta Lonni GRADE, MD;  Location: [facility];  Service: Orthopedics;  Laterality: Left;   WISDOM TOOTH EXTRACTION     Patient Active Problem List   Diagnosis Date Noted   Arthritis of carpometacarpal [facility]) joint of left thumb 01/24/2024   Status post total left knee replacement 02/25/2023   Status post total right knee replacement  03/27/2021   COVID-19 virus infection 06/09/2019   Acute respiratory failure with hypoxia (HCC) 06/09/2019    ONSET DATE: DOS 01/20/24  REFERRING DIAG: M18.12 (ICD-10-CM) - Arthritis of carpometacarpal (CMC) joint of left thumb   THERAPY DIAG:  Stiffness of left hand, not elsewhere classified - Plan: Ot plan of care cert/re-cert  Localized edema - Plan: Ot plan of care cert/re-cert  Muscle weakness (generalized) - Plan: Ot plan of care cert/re-cert  Other lack of coordination - Plan: Ot plan of care cert/re-cert  Pain in left hand - Plan: Ot plan of care cert/re-cert  Rationale for Evaluation and Treatment: Rehabilitation  SUBJECTIVE:   SUBJECTIVE STATEMENT: ~2 weeks s/p Lt thumb CMC J arthroplasty. She states history of pain and modifications to daily routines due to such bad pain, Currently not on narcotic pain meds (but maxing ibprophen), not having much pain now. She tailors/customizes uniforms for sports teams.    PERTINENT HISTORY: hip and knee arthroplasties; chronic peripheral neuropathy post covid 2-3/10   PRECAUTIONS: None  RED FLAGS: None   WEIGHT BEARING RESTRICTIONS: Yes NWB in Lt hand/thumb now and for next 4-6 weeks   PAIN:  Are you having pain? Yes: NPRS scale: 1/10 and tight feeling (on 800mg  ibprophen)  Pain location: Lt thumb sx area Pain description: aching, sore Aggravating factors: attempted weight bearing  Relieving factors: rest  FALLS: Has patient fallen in last 6 months?  No  PLOF: Independent  PATIENT GOALS: to improve hand and thumb use for work and daily tasks   NEXT MD VISIT: approx 4 weeks    OBJECTIVE: (All objective assessments below are from initial evaluation on: 02/06/24 unless otherwise specified.)   HAND DOMINANCE: Right   ADLs: Overall ADLs: States decreased ability to grab, hold household objects, pain and difficulty to open containers, perform FMS tasks (manipulate fasteners on clothing), mild to moderate bathing  problems as well.    FUNCTIONAL OUTCOME MEASURES: Eval: Patient Specific Functional Scale: 1 (gripping while pulling, gripping while manipulating his thumb, zipping/zipping, peeling an orange, removing adhesive stickers)  (Higher Score  =  Better Ability for the Selected Tasks)     UPPER EXTREMITY ROM     Shoulder to Wrist AROM Lt eval  Forearm supination Approx 75  Forearm pronation  Approx 70  Wrist flexion 34  Wrist extension 46  Wrist ulnar deviation   Wrist radial deviation   Functional dart thrower's motion (F-DTM) in ulnar flexion   F-DTM in radial extension    (Blank rows = not tested)   Hand AROM Lt Eval  Full Fist Ability (or Gap to Distal Palmar Crease) Full fist minus thumb   Thumb Opposition  (Kapandji Scale)  NT  Thumb MCP (0-60) NT  Thumb IP (0-80) NT  Thumb Radial Abduction Span NT  Thumb Palmar Abduction Span NT  (Blank rows = not tested)   UPPER EXTREMITY MMT:    Eval:  NT at eval due to recent and still healing injuries. Will be tested when appropriate.   MMT Lt TBD  Elbow flexion   Elbow extension   Forearm supination   Forearm pronation   Wrist flexion   Wrist extension   Wrist ulnar deviation   Wrist radial deviation   (Blank rows = not tested)  HAND FUNCTION: Eval: Observed weakness in affected Lt hand.  Details TBD when safe  Grip strength Right: TBD lbs, Left: TBD lbs   COORDINATION: Eval: Observed coordination impairments with affected Lt hand. Details TBD when safe  9 Hole Peg Test Right: TBDsec (TBD sec is WFL)   SENSATION: Eval:  Light touch intact today,  though diminished around sx area    EDEMA:   Eval:  Mildly swollen in Lt hand and wrist today, 7.5cm circumferentially around Lt P1 compared to  7cm in Rt P1   COGNITION: Eval: Overall cognitive status: WFL for evaluation today   OBSERVATIONS:   Eval: Swelling and tenderness within normal limits for postop timeframe, no signs of infection or dehiscence.   Lt thumb CMC  joint arthroplasty  TODAY'S TREATMENT:  Post-evaluation treatment:   Custom orthotic fabrication was indicated due to pt's healing Lt thumb arthroplasty surgery and need for safe, functional positioning. OT fabricated custom forearm-based thumb spica orthosis with IP joint free for pt today to immobilize the wrist and base of thumb. It fit well with no areas of pressure, pt states a comfortable fit. Pt was educated on the wearing schedule (on at all times except for hygiene and exercises), to avoid exposing it to sources of heat, to wipe clean as needed (do not wash, use harsh detergents), to call or come in ASAP if it is causing any irritation or is not achieving desired function. It will be checked/adjusted in upcoming sessions, as needed. Pt states understanding all directions.    For safety/self-care, the patient was recommended to do no pushing/pulling or weightbearing through the Lt hand or arm  now.  The patient was taught to care for the postsurgical wound by doing light touch desensitization, gentle scar mobilizations, keep moist with a Vaseline, keep covered until completely healed.  The patient should not be soaking the wound, either.  The patient can quickly shower and dab dry.  The patient was given a compressive stockinette to help with swelling.  The patient was also given the following home exercise program to perform in a nonpainful fashion approximately 4 times a day.  Each one was reviewed with the patient, with performance back to show understanding and no significant pain.  The patient leaves without any questions or concerns.   Exercises - Reach arms upward   - 4 x daily - 10 reps - Turn [employer] Facing Up & Down  - 4-6 x daily - 10-15 reps - Bend and Pull Back Wrist SLOWLY  - 4 x daily - 10-15 reps - Tendon Glides  - 4-6 x daily - 3-5 reps - 2-3 seconds hold - Thumb AROM IP Blocking  - 4-6 x daily - 10-15 reps  Patient Education - Scar Massage    PATIENT EDUCATION: Education  details: See tx section above for details  Person educated: Patient Education method: Verbal Instruction, Teach back, Handouts  Education comprehension: States and demonstrates understanding, Additional Education required    HOME EXERCISE PROGRAM: Access Code: [email or web address] URL: [email or web address] Date: 02/06/2024 Prepared by: Melvenia Ada   GOALS: Goals reviewed with patient? Yes   SHORT TERM GOALS: (STG required if POC>30 days) Target Date: 03/26/24  Pt will obtain protective, custom orthotic. Goal status: 02/06/24 : MET   2.  Pt will demo/state understanding of initial HEP to improve pain levels and prerequisite motion. Goal status: INITIAL   LONG TERM GOALS: Target Date: 04/06/24  Pt will improve functional ability by decreased impairment per PSFS assessment from 1 to 6 or better, for better quality of life. Goal status: INITIAL  2.  Pt will improve grip strength in Lt hand from unsafe to test to at least 25lbs for functional use at home and in IADLs. Goal status: INITIAL  3.  Pt will improve A/ROM in left wrist flexion/extension from 34/46 degrees respectively to at least 55 degrees each, to have functional motion for tasks like reach and grasp.  Goal status: INITIAL  4.  Pt will improve strength in Ltwrist flexion/extension from apparent 3 -/5 MMT to at least 4+/5 MMT to have increased functional ability to carry out selfcare and higher-level homecare tasks with less difficulty. Goal status: INITIAL  5.  Pt will improve coordination skills in Lt hand and arm, as seen by within functional limit score on nine-hole peg testing to have increased functional ability to carry out fine motor tasks (fasteners, etc.) and more complex, coordinated IADLs (meal prep, sports, etc.).  Goal status: INITIAL  6.  Pt will keep pain at worst from to 3/10 or better to have better sleep and occupational participation in daily roles. Goal status:  INITIAL   ASSESSMENT:  CLINICAL IMPRESSION: Patient is a 63 y.o. female who was seen today for occupational therapy evaluation for stiffness, weakness, swelling, decreased coordination and functional ability with the Lt thumb, hand and arm after chronic arthritis and recent [facility] joint arthroplasty.  The patient will benefit from outpatient occupational therapy to decrease symptoms, improve functional upper extremity use, and increase quality of life.  PERFORMANCE DEFICITS: in functional skills including ADLs, IADLs, coordination, dexterity, sensation, edema, ROM, strength, pain, fascial restrictions, flexibility, Fine motor control,  body mechanics, endurance, decreased knowledge of precautions, wound, and UE functional use, cognitive skills including problem solving and safety awareness, and psychosocial skills including coping strategies, environmental adaptation, habits, and routines and behaviors.   IMPAIRMENTS: are limiting patient from ADLs, IADLs, rest and sleep, and leisure.   COMORBIDITIES: may have co-morbidities  that affects occupational performance. Patient will benefit from skilled OT to address above impairments and improve overall function.  MODIFICATION OR ASSISTANCE TO COMPLETE EVALUATION: No modification of tasks or assist necessary to complete an evaluation.  OT OCCUPATIONAL PROFILE AND HISTORY: Problem focused assessment: Including review of records relating to presenting problem.  CLINICAL DECISION MAKING: Moderate - several treatment options, min-mod task modification necessary  REHAB POTENTIAL: Excellent  EVALUATION COMPLEXITY: Low      PLAN:  OT FREQUENCY: 1-2x/week  OT DURATION: 8 weeks through 04/06/24 and up to 12 total visits as needed   PLANNED INTERVENTIONS: 97535 self care/ADL training, 02889 therapeutic exercise, 97530 therapeutic activity, 97112 neuromuscular re-education, 97140 manual therapy, 97035 ultrasound, 97032 electrical stimulation (manual),  97760 Orthotic Initial, [id number] Orthotic/Prosthetic subsequent, compression bandaging, Dry needling, energy conservation, coping strategies training, and patient/family education  RECOMMENDED OTHER SERVICES: none now    CONSULTED AND AGREED WITH PLAN OF CARE: Patient  PLAN FOR NEXT SESSION:   Review initial HEP and recommendations, check orthosis and upgrade to 3 weeks postop   Melvenia Ada, OTR/L, CHT  02/06/2024, 6:29 PM   "

## 2024-02-06 ENCOUNTER — Encounter: Admitting: Orthopedic Surgery

## 2024-02-06 ENCOUNTER — Telehealth: Payer: Self-pay | Admitting: Orthopedic Surgery

## 2024-02-06 ENCOUNTER — Encounter: Payer: Self-pay | Admitting: Rehabilitative and Restorative Service Providers"

## 2024-02-06 ENCOUNTER — Ambulatory Visit (INDEPENDENT_AMBULATORY_CARE_PROVIDER_SITE_OTHER)

## 2024-02-06 ENCOUNTER — Encounter: Admitting: Rehabilitative and Restorative Service Providers"

## 2024-02-06 ENCOUNTER — Other Ambulatory Visit: Payer: Self-pay

## 2024-02-06 DIAGNOSIS — M25642 Stiffness of left hand, not elsewhere classified: Secondary | ICD-10-CM

## 2024-02-06 DIAGNOSIS — M6281 Muscle weakness (generalized): Secondary | ICD-10-CM | POA: Diagnosis not present

## 2024-02-06 DIAGNOSIS — M79642 Pain in left hand: Secondary | ICD-10-CM | POA: Diagnosis not present

## 2024-02-06 DIAGNOSIS — R600 Localized edema: Secondary | ICD-10-CM

## 2024-02-06 DIAGNOSIS — M1812 Unilateral primary osteoarthritis of first carpometacarpal joint, left hand: Secondary | ICD-10-CM

## 2024-02-06 DIAGNOSIS — R278 Other lack of coordination: Secondary | ICD-10-CM

## 2024-02-06 NOTE — Progress Notes (Signed)
" ° °  Tracy Orozco - 64 y.o. female MRN [id number]  Date of birth: 1960/04/06  Office Visit Note: Visit Date: 02/06/2024 PCP: Tracy Charlie ORN, MD Referred by: Tracy Charlie ORN, MD  Subjective:  HPI: Tracy Orozco is a 64 y.o. female who presents today for follow up 2 weeks status post left thumb CMC arthroplasty.  Doing well overall, pain is controlled.  Pertinent ROS were reviewed with the patient and found to be negative unless otherwise specified above in HPI.   Assessment & Plan: Visit Diagnoses:  1. Arthritis of carpometacarpal (CMC) joint of left thumb     Plan: Sutures tails trimmed today, wound is well-healing.  X-rays demonstrate stable appearance of the thumb metacarpal status post trapeziectomy.  She will be seen by occupational therapy for fabrication of a removable orthosis and begin standard postoperative protocol.  Follow-up myself in 4 weeks.  Follow-up: No follow-ups on file.   Meds & Orders: No orders of the defined types were placed in this encounter.   Orders Placed This Encounter  Procedures   XR Wrist Complete Left     Procedures: No procedures performed       Objective:   Vital Signs: There were no vitals taken for this visit.  Ortho Exam Left wrist: - Well-healed incision at the glabrous/nonglabrous juncture over the [facility] region of the thumb - Thumb circumduction without significant pain or crepitus - Hand is warm well-perfused, sensation intact in all distributions including DRSN   Imaging: XR Wrist Complete Left Result Date: 02/06/2024 X-rays of the left wrist demonstrate stable appearance of the thumb metacarpal status post trapeziectomy    Tracy Orozco, M.D. [facility], Hand Surgery  "

## 2024-02-06 NOTE — Telephone Encounter (Signed)
 Received

## 2024-02-06 NOTE — Telephone Encounter (Signed)
 Pt submitted medical release form, short term forms faxed to [employer], and pt paid $20.00

## 2024-02-07 ENCOUNTER — Encounter: Admitting: Orthopedic Surgery

## 2024-02-13 NOTE — Therapy (Signed)
 " OUTPATIENT OCCUPATIONAL THERAPY TREATMENT NOTE  Patient Name: Tracy Orozco MRN: [id number] DOB:03-02-1960, 64 y.o., female Today's Date: 02/14/2024  PCP: Vernadine HAMS MD REFERRING PROVIDER: Arlinda Buster, MD   END OF SESSION:  OT End of Session - 02/14/24 1147     Visit Number 2    Number of Visits 12    Date for Recertification  04/06/24    Authorization Type BCBS    OT Start Time 1147    OT Stop Time 1225    OT Time Calculation (min) 38 min    Equipment Utilized During Treatment compression materials    Activity Tolerance Patient tolerated treatment well;No increased pain;Patient limited by fatigue;Patient limited by pain    Behavior During Therapy [facility] for tasks assessed/performed           Past Medical History:  Diagnosis Date   Arthritis    Hypertension    Past Surgical History:  Procedure Laterality Date   ABDOMINAL HYSTERECTOMY     ANKLE SURGERY Left    BACK SURGERY     lumbar fusuion   FINGER ARTHROPLASTY Left 01/24/2024   Procedure: ARTHROPLASTY, FINGER;  Surgeon: Arlinda Buster, MD;  Location: [facility];  Service: Orthopedics;  Laterality: Left;  LEFT THUMB CMC ARTHROPLASTY WITH INTERNAL BRACE   JOINT REPLACEMENT     Bilateral hip replacements.   SPINAL FUSION     STRABISMUS SURGERY Bilateral    TOTAL HIP ARTHROPLASTY Bilateral    TOTAL KNEE ARTHROPLASTY Right 03/27/2021   Procedure: Right TOTAL KNEE ARTHROPLASTY;  Surgeon: Vernetta Lonni GRADE, MD;  Location: [facility];  Service: Orthopedics;  Laterality: Right;   TOTAL KNEE ARTHROPLASTY Left 02/25/2023   Procedure: LEFT TOTAL KNEE ARTHROPLASTY;  Surgeon: Vernetta Lonni GRADE, MD;  Location: [facility];  Service: Orthopedics;  Laterality: Left;   WISDOM TOOTH EXTRACTION     Patient Active Problem List   Diagnosis Date Noted   Arthritis of carpometacarpal [facility]) joint of left thumb 01/24/2024   Status post total left knee replacement 02/25/2023   Status post total right knee  replacement 03/27/2021   COVID-19 virus infection 06/09/2019   Acute respiratory failure with hypoxia (HCC) 06/09/2019    ONSET DATE: DOS 01/20/24  REFERRING DIAG: M18.12 (ICD-10-CM) - Arthritis of carpometacarpal (CMC) joint of left thumb   THERAPY DIAG:  Localized edema  Muscle weakness (generalized)  Other lack of coordination  Pain in left hand  Stiffness of left hand, not elsewhere classified  Rationale for Evaluation and Treatment: Rehabilitation  PERTINENT HISTORY: hip and knee arthroplasties; chronic peripheral neuropathy post covid 2-3/10  She states history of pain and modifications to daily routines due to such bad pain, Currently not on narcotic pain meds (but maxing ibprophen), not having much pain now. She tailors/customizes uniforms for sports teams.   PRECAUTIONS: None  RED FLAGS: None   WEIGHT BEARING RESTRICTIONS: Yes NWB in Lt hand/thumb now and for next 4-6 weeks    SUBJECTIVE:   SUBJECTIVE STATEMENT: ~3 weeks s/p Lt thumb CMC J arthroplasty. She states she has many questions about her pain, her sensation,  expectations, etc.    PAIN:  Are you having pain? Yes: NPRS scale: 1-2/10 and tight feeling (on 800mg  ibprophen)  Pain location: Lt thumb sx area Pain description: aching, sore Aggravating factors: attempted weight bearing  Relieving factors: rest    PATIENT GOALS: to improve hand and thumb use for work and daily tasks   NEXT MD VISIT: approx 4 weeks  OBJECTIVE: (All objective assessments below are from initial evaluation on: 02/06/24 unless otherwise specified.)   HAND DOMINANCE: Right   ADLs: Overall ADLs: States decreased ability to grab, hold household objects, pain and difficulty to open containers, perform FMS tasks (manipulate fasteners on clothing), mild to moderate bathing problems as well.    FUNCTIONAL OUTCOME MEASURES: Eval: Patient Specific Functional Scale: 1 (gripping while pulling, gripping while manipulating his  thumb, zipping/zipping, peeling an orange, removing adhesive stickers)  (Higher Score  =  Better Ability for the Selected Tasks)     UPPER EXTREMITY ROM     Shoulder to Wrist AROM Lt eval Lt 02/14/24  Forearm supination Approx 75 85  Forearm pronation  Approx 70 85  Wrist flexion 34 28  Wrist extension 46 64  Wrist ulnar deviation    Wrist radial deviation    Functional dart thrower's motion (F-DTM) in ulnar flexion    F-DTM in radial extension     (Blank rows = not tested)   Hand AROM Lt Eval Lt 02/14/24  Full Fist Ability (or Gap to Distal Palmar Crease) Full fist minus thumb  Fingers still form a loose fist minus the thumb  Thumb Opposition  (Kapandji Scale)  NT   Thumb MCP (0-60) NT 40  Thumb IP (0-80) NT 20  Thumb Radial Abduction Span NT   Thumb Palmar Abduction Span NT   (Blank rows = not tested)   UPPER EXTREMITY MMT:    Eval:  NT at eval due to recent and still healing injuries. Will be tested when appropriate.   MMT Lt TBD  Elbow flexion   Elbow extension   Forearm supination   Forearm pronation   Wrist flexion   Wrist extension   Wrist ulnar deviation   Wrist radial deviation   (Blank rows = not tested)  HAND FUNCTION: Eval: Observed weakness in affected Lt hand.  Details TBD when safe  Grip strength Right: TBD lbs, Left: TBD lbs   COORDINATION: Eval: Observed coordination impairments with affected Lt hand. Details TBD when safe  9 Hole Peg Test Right: TBDsec (TBD sec is WFL)   EDEMA:   Eval:  Mildly swollen in Lt hand and wrist today, 7.5cm circumferentially around Lt P1 compared to  7cm in Rt P1    OBSERVATIONS:   Eval: Swelling and tenderness within normal limits for postop timeframe, no signs of infection or dehiscence.   Lt thumb CMC joint arthroplasty  TODAY'S TREATMENT:  02/14/24: She starts with active range of motion for exercise as well as new measures which show some improvement with wrist extension but some tightness with wrist  flexion.  OT educates that she is safe to use moist heat for 5 minutes before moving which might help her get looser, and we also reviewed scar mobilization, OT providing blue nonslip Dycem to make slightly more aggressive.  None of her exercises or activities should be painful to her, and she needs to continue to be nonweightbearing, no CMC joint motion, and listen to her body.  The orthosis seems to be working well and does not need adjusted.  She was given he was given new compression garments today.  OT upgrades her HEP to include MP joint blocking motion motion which she tolerates fairly well.  She was encouraged to not hyperextend the thumb as this will cause pain.  OT also answers several questions about functional activities, weightbearing, modifying activities, expectations postop, expectations for the look of her hand and the use of  her hand and arm.  She states understanding and that she has a desire for more knowledge as this gives her power and more comfort with the postop process.  She leaves in no significant pain and states understanding all directions.   Exercises - Turn [employer] Facing Up & Down  - 4-6 x daily - 10-15 reps - Bend and Pull Back Wrist SLOWLY  - 4 x daily - 10-15 reps - Tendon Glides  - 4-6 x daily - 3-5 reps - 2-3 seconds hold - Thumb AROM IP Blocking  - 4-6 x daily - 10-15 reps - Thumb AROM MP Blocking  - 2-3 x daily - 10 reps Patient Education - Scar Massage  PATIENT EDUCATION: Education details: See tx section above for details  Person educated: Patient Education method: Verbal Instruction, Teach back, Handouts  Education comprehension: States and demonstrates understanding, Additional Education required    HOME EXERCISE PROGRAM: Access Code: [email or web address] URL: [email or web address] Date: 02/06/2024 Prepared by: Melvenia Ada   GOALS: Goals reviewed with patient? Yes   SHORT TERM GOALS: (STG required if POC>30 days) Target Date: 03/26/24  Pt  will obtain protective, custom orthotic. Goal status: 02/06/24 : MET   2.  Pt will demo/state understanding of initial HEP to improve pain levels and prerequisite motion. Goal status: INITIAL   LONG TERM GOALS: Target Date: 04/06/24  Pt will improve functional ability by decreased impairment per PSFS assessment from 1 to 6 or better, for better quality of life. Goal status: INITIAL  2.  Pt will improve grip strength in Lt hand from unsafe to test to at least 25lbs for functional use at home and in IADLs. Goal status: INITIAL  3.  Pt will improve A/ROM in left wrist flexion/extension from 34/46 degrees respectively to at least 55 degrees each, to have functional motion for tasks like reach and grasp.  Goal status: INITIAL  4.  Pt will improve strength in Ltwrist flexion/extension from apparent 3 -/5 MMT to at least 4+/5 MMT to have increased functional ability to carry out selfcare and higher-level homecare tasks with less difficulty. Goal status: INITIAL  5.  Pt will improve coordination skills in Lt hand and arm, as seen by within functional limit score on nine-hole peg testing to have increased functional ability to carry out fine motor tasks (fasteners, etc.) and more complex, coordinated IADLs (meal prep, sports, etc.).  Goal status: INITIAL  6.  Pt will keep pain at worst from to 3/10 or better to have better sleep and occupational participation in daily roles. Goal status: INITIAL   ASSESSMENT:  CLINICAL IMPRESSION: 02/14/24: Doing well all things considered, she does need to continue to work on her wrist motion and add MP joint motion now, but still needs to be very cautious about no movement at the [facility] joint of a significant nature.    PLAN:  OT FREQUENCY: 1-2x/week  OT DURATION: 8 weeks through 04/06/24 and up to 12 total visits as needed   PLANNED INTERVENTIONS: 97535 self care/ADL training, 02889 therapeutic exercise, 97530 therapeutic activity, 97112 neuromuscular  re-education, 97140 manual therapy, 97035 ultrasound, 97032 electrical stimulation (manual), 97760 Orthotic Initial, [id number] Orthotic/Prosthetic subsequent, compression bandaging, Dry needling, energy conservation, coping strategies training, and patient/family education   CONSULTED AND AGREED WITH PLAN OF CARE: Patient  PLAN FOR NEXT SESSION:   Upgrade to 4 weeks postop including a new hand-based orthosis, new CMC joint active range of motion as tolerated.  Melvenia Ada, OTR/L, CHT  02/14/2024, 12:29 PM   "

## 2024-02-14 ENCOUNTER — Encounter: Payer: Self-pay | Admitting: Rehabilitative and Restorative Service Providers"

## 2024-02-14 ENCOUNTER — Ambulatory Visit: Admitting: Rehabilitative and Restorative Service Providers"

## 2024-02-14 DIAGNOSIS — M6281 Muscle weakness (generalized): Secondary | ICD-10-CM

## 2024-02-14 DIAGNOSIS — R600 Localized edema: Secondary | ICD-10-CM | POA: Diagnosis not present

## 2024-02-14 DIAGNOSIS — R278 Other lack of coordination: Secondary | ICD-10-CM | POA: Diagnosis not present

## 2024-02-14 DIAGNOSIS — M25642 Stiffness of left hand, not elsewhere classified: Secondary | ICD-10-CM | POA: Diagnosis not present

## 2024-02-14 DIAGNOSIS — M79642 Pain in left hand: Secondary | ICD-10-CM | POA: Diagnosis not present

## 2024-02-20 NOTE — Therapy (Signed)
 " OUTPATIENT OCCUPATIONAL THERAPY TREATMENT NOTE  Patient Name: Tracy Orozco MRN: [id number] DOB:04/01/1960, 64 y.o., female Today's Date: 02/21/2024  PCP: Vernadine HAMS MD REFERRING PROVIDER: Arlinda Buster, MD   END OF SESSION:  OT End of Session - 02/21/24 1151     Visit Number 3    Number of Visits 12    Date for Recertification  04/06/24    Authorization Type BCBS    OT Start Time 1151    OT Stop Time 1226    OT Time Calculation (min) 35 min    Equipment Utilized During Treatment compression materials    Activity Tolerance Patient tolerated treatment well;No increased pain;Patient limited by fatigue;Patient limited by pain    Behavior During Therapy [facility] for tasks assessed/performed            Past Medical History:  Diagnosis Date   Arthritis    Hypertension    Past Surgical History:  Procedure Laterality Date   ABDOMINAL HYSTERECTOMY     ANKLE SURGERY Left    BACK SURGERY     lumbar fusuion   FINGER ARTHROPLASTY Left 01/24/2024   Procedure: ARTHROPLASTY, FINGER;  Surgeon: Arlinda Buster, MD;  Location: [facility];  Service: Orthopedics;  Laterality: Left;  LEFT THUMB CMC ARTHROPLASTY WITH INTERNAL BRACE   JOINT REPLACEMENT     Bilateral hip replacements.   SPINAL FUSION     STRABISMUS SURGERY Bilateral    TOTAL HIP ARTHROPLASTY Bilateral    TOTAL KNEE ARTHROPLASTY Right 03/27/2021   Procedure: Right TOTAL KNEE ARTHROPLASTY;  Surgeon: Vernetta Lonni GRADE, MD;  Location: [facility];  Service: Orthopedics;  Laterality: Right;   TOTAL KNEE ARTHROPLASTY Left 02/25/2023   Procedure: LEFT TOTAL KNEE ARTHROPLASTY;  Surgeon: Vernetta Lonni GRADE, MD;  Location: [facility];  Service: Orthopedics;  Laterality: Left;   WISDOM TOOTH EXTRACTION     Patient Active Problem List   Diagnosis Date Noted   Arthritis of carpometacarpal [facility]) joint of left thumb 01/24/2024   Status post total left knee replacement 02/25/2023   Status post total right knee  replacement 03/27/2021   COVID-19 virus infection 06/09/2019   Acute respiratory failure with hypoxia (HCC) 06/09/2019    ONSET DATE: DOS 01/20/24  REFERRING DIAG: M18.12 (ICD-10-CM) - Arthritis of carpometacarpal (CMC) joint of left thumb   THERAPY DIAG:  Localized edema  Muscle weakness (generalized)  Other lack of coordination  Pain in left hand  Stiffness of left hand, not elsewhere classified  Rationale for Evaluation and Treatment: Rehabilitation  PERTINENT HISTORY: hip and knee arthroplasties; chronic peripheral neuropathy post covid 2-3/10  She states history of pain and modifications to daily routines due to such bad pain, Currently not on narcotic pain meds (but maxing ibprophen), not having much pain now. She tailors/customizes uniforms for sports teams.   PRECAUTIONS: None  RED FLAGS: None   WEIGHT BEARING RESTRICTIONS: Yes NWB in Lt hand/thumb now and for next 4-6 weeks    SUBJECTIVE:   SUBJECTIVE STATEMENT: 4+ weeks s/p Lt thumb CMC J arthroplasty. She states having some pain and clicking in the thumb joint if she tries to move it too much.  OT states this can be normal and she should limit clicking and force if possible.    PAIN:  Are you having pain? Yes: NPRS scale: 0-1/10 and tight feeling (on 800mg  ibprophen)  Pain location: Lt thumb sx area Pain description: aching, sore Aggravating factors: attempted weight bearing  Relieving factors: rest    PATIENT GOALS:  to improve hand and thumb use for work and daily tasks   NEXT MD VISIT: approx 4 weeks    OBJECTIVE: (All objective assessments below are from initial evaluation on: 02/06/24 unless otherwise specified.)   HAND DOMINANCE: Right   ADLs: Overall ADLs: States decreased ability to grab, hold household objects, pain and difficulty to open containers, perform FMS tasks (manipulate fasteners on clothing), mild to moderate bathing problems as well.    FUNCTIONAL OUTCOME MEASURES: Eval:  Patient Specific Functional Scale: 1 (gripping while pulling, gripping while manipulating his thumb, zipping/zipping, peeling an orange, removing adhesive stickers)  (Higher Score  =  Better Ability for the Selected Tasks)     UPPER EXTREMITY ROM     Shoulder to Wrist AROM Lt eval Lt 02/14/24 LT 02/21/24  Forearm supination Approx 75 85   Forearm pronation  Approx 70 85   Wrist flexion 34 28 33  Wrist extension 46 64 44  Wrist ulnar deviation     Wrist radial deviation     Functional dart thrower's motion (F-DTM) in ulnar flexion     F-DTM in radial extension      (Blank rows = not tested)   Hand AROM Lt Eval Lt 02/14/24 Lt 02/21/24  Full Fist Ability (or Gap to Distal Palmar Crease) Full fist minus thumb  Fingers still form a loose fist minus the thumb   Thumb Opposition  (Kapandji Scale)  NT  4/10  Thumb MCP (0-60) NT 40 21  Thumb IP (0-80) NT 20 32  Thumb Radial Abduction Span NT    Thumb Palmar Abduction Span NT  45  (Blank rows = not tested)   UPPER EXTREMITY MMT:    Eval:  NT at eval due to recent and still healing injuries. Will be tested when appropriate.   MMT Lt TBD  Elbow flexion   Elbow extension   Forearm supination   Forearm pronation   Wrist flexion   Wrist extension   Wrist ulnar deviation   Wrist radial deviation   (Blank rows = not tested)  HAND FUNCTION: Eval: Observed weakness in affected Lt hand.  Details TBD when safe  Grip strength Right: TBD lbs, Left: TBD lbs   COORDINATION: Eval: Observed coordination impairments with affected Lt hand. Details TBD when safe  9 Hole Peg Test Right: TBDsec (TBD sec is WFL)   EDEMA:   Eval:  Mildly swollen in Lt hand and wrist today, 7.5cm circumferentially around Lt P1 compared to  7cm in Rt P1    OBSERVATIONS:   Eval: Swelling and tenderness within normal limits for postop timeframe, no signs of infection or dehiscence.   Lt thumb CMC joint arthroplasty  TODAY'S TREATMENT:  02/21/24: Now that she  is 4 weeks postop, a new hand-based orthosis was indicated.  She also should still wear her forearm-based orthosis in the night for 2 weeks for protection.   OT also educates on new home exercises/activities including thumb opposition palmar abduction, circumduction, etc.  She tolerates these fairly well and was reminded for safety/scale of care to not lift push or pull anything heavy or painful, still try to withhold using the hand for daily functional activities unless they are very light and well-tolerated.  She leaves stating understanding   Custom orthotic fabrication was indicated due to pt's healing thumb CMC joint arthroplasty 4 weeks out and need for safe, functional positioning. OT fabricated custom hand-based thumb spica orthosis for pt today to protect the thumb but allow wrist motion.  It fit well with no areas of pressure, pt states a comfortable fit. Pt was educated on the wearing schedule (on at all times except for hygiene and exercises, still wear forearm-based brace in the night), to avoid exposing it to sources of heat, to wipe clean as needed (do not wash, use harsh detergents), to call or come in ASAP if it is causing any irritation or is not achieving desired function. It will be checked/adjusted in upcoming sessions, as needed. Pt states understanding all directions.     Exercises/activities performed/educated on today - Bend and Pull Back Wrist SLOWLY  - 4 x daily - 10-15 reps - Tendon Glides  - 4-6 x daily - 3-5 reps - 2-3 seconds hold - Thumb AROM IP Blocking  - 4-6 x daily - 10-15 reps - Thumb AROM MP Blocking  - 2-3 x daily - 10 reps - Seated Thumb Circumduction AROM  - 4-6 x daily - 10-15 reps - Thumb Opposition  - 4-6 x daily - 10 reps - Palmar abduction  - 4-6 x daily - 10-15 reps Patient Education - Scar Massage   PATIENT EDUCATION: Education details: See tx section above for details  Person educated: Patient Education method: Verbal Instruction, Teach back,  Handouts  Education comprehension: States and demonstrates understanding, Additional Education required    HOME EXERCISE PROGRAM: Access Code: [email or web address] URL: [email or web address] Date: 02/21/2024 Prepared by: Melvenia Ada   GOALS: Goals reviewed with patient? Yes   SHORT TERM GOALS: (STG required if POC>30 days) Target Date: 03/26/24  Pt will obtain protective, custom orthotic. Goal status: 02/06/24 : MET   2.  Pt will demo/state understanding of initial HEP to improve pain levels and prerequisite motion. Goal status: INITIAL   LONG TERM GOALS: Target Date: 04/06/24  Pt will improve functional ability by decreased impairment per PSFS assessment from 1 to 6 or better, for better quality of life. Goal status: INITIAL  2.  Pt will improve grip strength in Lt hand from unsafe to test to at least 25lbs for functional use at home and in IADLs. Goal status: INITIAL  3.  Pt will improve A/ROM in left wrist flexion/extension from 34/46 degrees respectively to at least 55 degrees each, to have functional motion for tasks like reach and grasp.  Goal status: INITIAL  4.  Pt will improve strength in Ltwrist flexion/extension from apparent 3 -/5 MMT to at least 4+/5 MMT to have increased functional ability to carry out selfcare and higher-level homecare tasks with less difficulty. Goal status: INITIAL  5.  Pt will improve coordination skills in Lt hand and arm, as seen by within functional limit score on nine-hole peg testing to have increased functional ability to carry out fine motor tasks (fasteners, etc.) and more complex, coordinated IADLs (meal prep, sports, etc.).  Goal status: INITIAL  6.  Pt will keep pain at worst from to 3/10 or better to have better sleep and occupational participation in daily roles. Goal status: INITIAL   ASSESSMENT:  CLINICAL IMPRESSION: 02/21/24: Tolerating hand-based brace well, making slow improvements.  Carry on  02/14/24: Doing well  all things considered, she does need to continue to work on her wrist motion and add MP joint motion now, but still needs to be very cautious about no movement at the [facility] joint of a significant nature.    PLAN:  OT FREQUENCY: 1-2x/week  OT DURATION: 8 weeks through 04/06/24 and up to 12 total visits as needed   PLANNED INTERVENTIONS: 97535 self  care/ADL training, 02889 therapeutic exercise, 97530 therapeutic activity, 97112 neuromuscular re-education, 97140 manual therapy, 97035 ultrasound, [id number] electrical stimulation (manual), [id number] Orthotic Initial, [id number] Orthotic/Prosthetic subsequent, compression bandaging, Dry needling, energy conservation, coping strategies training, and patient/family education   CONSULTED AND AGREED WITH PLAN OF CARE: Patient  PLAN FOR NEXT SESSION:   Upgrade to 5 weeks postop, start light stretches, perform fine motor skills as tolerated.  Melvenia Ada, OTR/L, CHT  02/21/2024, 12:54 PM   "

## 2024-02-21 ENCOUNTER — Ambulatory Visit: Admitting: Rehabilitative and Restorative Service Providers"

## 2024-02-21 ENCOUNTER — Encounter: Payer: Self-pay | Admitting: Rehabilitative and Restorative Service Providers"

## 2024-02-21 DIAGNOSIS — M6281 Muscle weakness (generalized): Secondary | ICD-10-CM | POA: Diagnosis not present

## 2024-02-21 DIAGNOSIS — R600 Localized edema: Secondary | ICD-10-CM

## 2024-02-21 DIAGNOSIS — M25642 Stiffness of left hand, not elsewhere classified: Secondary | ICD-10-CM

## 2024-02-21 DIAGNOSIS — R278 Other lack of coordination: Secondary | ICD-10-CM | POA: Diagnosis not present

## 2024-02-21 DIAGNOSIS — M79642 Pain in left hand: Secondary | ICD-10-CM

## 2024-02-28 ENCOUNTER — Encounter: Payer: Self-pay | Admitting: Rehabilitative and Restorative Service Providers"

## 2024-02-28 ENCOUNTER — Ambulatory Visit: Admitting: Rehabilitative and Restorative Service Providers"

## 2024-02-28 DIAGNOSIS — M25642 Stiffness of left hand, not elsewhere classified: Secondary | ICD-10-CM

## 2024-02-28 DIAGNOSIS — R600 Localized edema: Secondary | ICD-10-CM

## 2024-02-28 DIAGNOSIS — M6281 Muscle weakness (generalized): Secondary | ICD-10-CM

## 2024-02-28 DIAGNOSIS — R278 Other lack of coordination: Secondary | ICD-10-CM | POA: Diagnosis not present

## 2024-02-28 DIAGNOSIS — M79642 Pain in left hand: Secondary | ICD-10-CM | POA: Diagnosis not present

## 2024-02-28 NOTE — Therapy (Signed)
 " OUTPATIENT OCCUPATIONAL THERAPY TREATMENT NOTE  Patient Name: Tracy Orozco MRN: [id number] DOB:1960-08-14, 64 y.o., female Today's Date: 02/28/2024  PCP: Vernadine HAMS MD REFERRING PROVIDER: Arlinda Buster, MD   END OF SESSION:  OT End of Session - 02/28/24 1118     Visit Number 4    Number of Visits 12    Date for Recertification  04/06/24    Authorization Type BCBS    OT Start Time 1118    OT Stop Time 1151    OT Time Calculation (min) 33 min    Activity Tolerance Patient tolerated treatment well;No increased pain;Patient limited by fatigue;Patient limited by pain    Behavior During Therapy [facility] for tasks assessed/performed             Past Medical History:  Diagnosis Date   Arthritis    Hypertension    Past Surgical History:  Procedure Laterality Date   ABDOMINAL HYSTERECTOMY     ANKLE SURGERY Left    BACK SURGERY     lumbar fusuion   FINGER ARTHROPLASTY Left 01/24/2024   Procedure: ARTHROPLASTY, FINGER;  Surgeon: Arlinda Buster, MD;  Location: [facility];  Service: Orthopedics;  Laterality: Left;  LEFT THUMB CMC ARTHROPLASTY WITH INTERNAL BRACE   JOINT REPLACEMENT     Bilateral hip replacements.   SPINAL FUSION     STRABISMUS SURGERY Bilateral    TOTAL HIP ARTHROPLASTY Bilateral    TOTAL KNEE ARTHROPLASTY Right 03/27/2021   Procedure: Right TOTAL KNEE ARTHROPLASTY;  Surgeon: Vernetta Lonni GRADE, MD;  Location: [facility];  Service: Orthopedics;  Laterality: Right;   TOTAL KNEE ARTHROPLASTY Left 02/25/2023   Procedure: LEFT TOTAL KNEE ARTHROPLASTY;  Surgeon: Vernetta Lonni GRADE, MD;  Location: [facility];  Service: Orthopedics;  Laterality: Left;   WISDOM TOOTH EXTRACTION     Patient Active Problem List   Diagnosis Date Noted   Arthritis of carpometacarpal [facility]) joint of left thumb 01/24/2024   Status post total left knee replacement 02/25/2023   Status post total right knee replacement 03/27/2021   COVID-19 virus infection 06/09/2019    Acute respiratory failure with hypoxia (HCC) 06/09/2019    ONSET DATE: DOS 01/20/24  REFERRING DIAG: M18.12 (ICD-10-CM) - Arthritis of carpometacarpal (CMC) joint of left thumb   THERAPY DIAG:  Localized edema  Muscle weakness (generalized)  Other lack of coordination  Stiffness of left hand, not elsewhere classified  Pain in left hand  Rationale for Evaluation and Treatment: Rehabilitation  PERTINENT HISTORY: hip and knee arthroplasties; chronic peripheral neuropathy post covid 2-3/10  She states history of pain and modifications to daily routines due to such bad pain, Currently not on narcotic pain meds (but maxing ibprophen), not having much pain now. She tailors/customizes uniforms for sports teams.   PRECAUTIONS: None  RED FLAGS: None   WEIGHT BEARING RESTRICTIONS: Yes NWB in Lt hand/thumb now and for next 4-6 weeks    SUBJECTIVE:   SUBJECTIVE STATEMENT: 5+ weeks s/p Lt thumb CMC J arthroplasty. She states generally doing well and tolerated the new orthosis very well.   PAIN:  Are you having pain? Yes: NPRS scale: 0-1/10 and tight feeling (on 800mg  ibprophen)  Pain location: Lt thumb sx area Pain description: aching, sore Aggravating factors: attempted weight bearing  Relieving factors: rest    PATIENT GOALS: to improve hand and thumb use for work and daily tasks   NEXT MD VISIT: approx 4 weeks    OBJECTIVE: (All objective assessments below are from initial evaluation on:  02/06/24 unless otherwise specified.)   HAND DOMINANCE: Right   ADLs: Overall ADLs: States decreased ability to grab, hold household objects, pain and difficulty to open containers, perform FMS tasks (manipulate fasteners on clothing), mild to moderate bathing problems as well.    FUNCTIONAL OUTCOME MEASURES: Eval: Patient Specific Functional Scale: 1 (gripping while pulling, gripping while manipulating his thumb, zipping/zipping, peeling an orange, removing adhesive stickers)  (Higher  Score  =  Better Ability for the Selected Tasks)     UPPER EXTREMITY ROM     Shoulder to Wrist AROM Lt eval Lt 02/14/24 LT 02/21/24 Lt 02/28/24  Forearm supination Approx 75 85    Forearm pronation  Approx 70 85    Wrist flexion 34 28 33 40  Wrist extension 46 64 44 64  Wrist ulnar deviation      Wrist radial deviation      Functional dart thrower's motion (F-DTM) in ulnar flexion      F-DTM in radial extension       (Blank rows = not tested)   Hand AROM Lt Eval Lt 02/14/24 Lt 02/21/24 Lt 02/28/24  Full Fist Ability (or Gap to Distal Palmar Crease) Full fist minus thumb  Fingers still form a loose fist minus the thumb    Thumb Opposition  (Kapandji Scale)  NT  4/10 6/10  Thumb MCP (0-60) NT 40 21 24  Thumb IP (0-80) NT 20 32 41  Thumb Radial Abduction Span NT     Thumb Palmar Abduction Span NT  45 43  (Blank rows = not tested)   UPPER EXTREMITY MMT:    Eval:  NT at eval due to recent and still healing injuries. Will be tested when appropriate.   MMT Lt TBD  Elbow flexion   Elbow extension   Forearm supination   Forearm pronation   Wrist flexion   Wrist extension   Wrist ulnar deviation   Wrist radial deviation   (Blank rows = not tested)  HAND FUNCTION: Eval: Observed weakness in affected Lt hand.  Details TBD when safe  Grip strength Right: TBD lbs, Left: TBD lbs   COORDINATION: 02/28/24: 9 Hole Peg Test LT: 27 sec (25 sec is WFL)   EDEMA:   Eval:  Mildly swollen in Lt hand and wrist today, 7.5cm circumferentially around Lt P1 compared to  7cm in Rt P1    OBSERVATIONS:   Eval: Swelling and tenderness within normal limits for postop timeframe, no signs of infection or dehiscence.   Lt thumb CMC joint arthroplasty  TODAY'S TREATMENT:  02/28/24: She starts with active range of motion for exercise as well as new measures which shows she is improving significantly, especially at the wrist now that she is in her smaller orthosis.  She still does wear the larger  orthosis at night.  We reviewed safety precautions in terms of not lifting gripping or squeezing anything more than 1 to 2 pounds.  Light fine motor skills were encouraged, and she performs a nine-hole peg test today and learns in hand manipulation skills to perform each day in a pain-free way.  OT also upgrades her home exercises to include light passive stretches at the thumb and fingers and wrist as tolerated.  Each 1 was performed with her to ensure pain-free performance and understanding.  She states tolerating well, understanding all directions and leaves without questions or concerns.     Exercises - Wrist Flexion Stretch  - 4 x daily - 3-5 reps - 15 sec  hold - Wrist Prayer Stretch  - 4 x daily - 3-5 reps - 15 sec hold - Seated Finger Composite Flexion Stretch  - 4 x daily - 3-5 reps - 15 hold - Stretch thumb toward base of small finger (put hand in LAP)  - 2-3 x daily - 3-5 reps - 15 sec hold - Tendon Glides  - 4-6 x daily - 3-5 reps - 2-3 seconds hold - Thumb AROM IP Blocking  - 4-6 x daily - 10-15 reps - Thumb AROM MP Blocking  - 2-3 x daily - 10 reps - Seated Thumb Circumduction AROM  - 4-6 x daily - 10-15 reps - Thumb Opposition  - 4-6 x daily - 10 reps - Palmar abduction  - 4-6 x daily - 10-15 reps Patient Education - Scar Massage  In-Hand Manipulation Skills Rotation:  Hold pen, try to twirl like a baton, keeping parallel (or flat) with surface of table. Try going BOTH directions 10x  Flip:  Hold pen in writing position,  flip in an arch to erase position, then back to write position. Do not lift hand off table.  10x  Translation:  Open hand palm up,  put an object in your palm and then use your fingers and thumb to move it to the tips of your fingers, pinched against your thumb. (bigger is easier (fat marker), smaller is harder (penny)) 10x  Shift:  Hold pen like a dart, start shifting it forward & backwards from tip to base (like putting a key in a key hole) 10x      PATIENT EDUCATION: Education details: See tx section above for details  Person educated: Patient Education method: Verbal Instruction, Teach back, Handouts  Education comprehension: States and demonstrates understanding, Additional Education required    HOME EXERCISE PROGRAM: Access Code: [email or web address] URL: [email or web address] Date: 02/21/2024 Prepared by: Melvenia Ada   GOALS: Goals reviewed with patient? Yes   SHORT TERM GOALS: (STG required if POC>30 days) Target Date: 03/26/24  Pt will obtain protective, custom orthotic. Goal status: 02/06/24 : MET   2.  Pt will demo/state understanding of initial HEP to improve pain levels and prerequisite motion. Goal status: 02/28/2024: Goal met   LONG TERM GOALS: Target Date: 04/06/24  Pt will improve functional ability by decreased impairment per PSFS assessment from 1 to 6 or better, for better quality of life. Goal status: INITIAL  2.  Pt will improve grip strength in Lt hand from unsafe to test to at least 25lbs for functional use at home and in IADLs. Goal status: INITIAL  3.  Pt will improve A/ROM in left wrist flexion/extension from 34/46 degrees respectively to at least 55 degrees each, to have functional motion for tasks like reach and grasp.  Goal status: INITIAL  4.  Pt will improve strength in Ltwrist flexion/extension from apparent 3 -/5 MMT to at least 4+/5 MMT to have increased functional ability to carry out selfcare and higher-level homecare tasks with less difficulty. Goal status: INITIAL  5.  Pt will improve coordination skills in Lt hand and arm, as seen by within functional limit score on nine-hole peg testing to have increased functional ability to carry out fine motor tasks (fasteners, etc.) and more complex, coordinated IADLs (meal prep, sports, etc.).  Goal status: INITIAL  6.  Pt will keep pain at worst from to 3/10 or better to have better sleep and occupational participation in daily  roles. Goal status: INITIAL   ASSESSMENT:  CLINICAL IMPRESSION: 02/28/24: She is  doing very well in her new orthosis, tolerating stretches well, she just needs to ensure that she does not push herself past protocols or do anything too strenuous or painful while her pain is very low now.   02/21/24: Tolerating hand-based brace well, making slow improvements.  Carry on  02/14/24: Doing well all things considered, she does need to continue to work on her wrist motion and add MP joint motion now, but still needs to be very cautious about no movement at the [facility] joint of a significant nature.    PLAN:  OT FREQUENCY: 1-2x/week  OT DURATION: 8 weeks through 04/06/24 and up to 12 total visits as needed   PLANNED INTERVENTIONS: 97535 self care/ADL training, 02889 therapeutic exercise, 97530 therapeutic activity, 97112 neuromuscular re-education, 97140 manual therapy, 97035 ultrasound, 97032 electrical stimulation (manual), 97760 Orthotic Initial, [id number] Orthotic/Prosthetic subsequent, compression bandaging, Dry needling, energy conservation, coping strategies training, and patient/family education   CONSULTED AND AGREED WITH PLAN OF CARE: Patient  PLAN FOR NEXT SESSION:   Upgrade to 6 weeks postop, check stretches, upgrade to light isometrics at the thumb and possibly grip.  Melvenia Ada, OTR/L, CHT  02/28/2024, 1:26 PM   "

## 2024-03-06 ENCOUNTER — Encounter: Payer: Self-pay | Admitting: Rehabilitative and Restorative Service Providers"

## 2024-03-06 ENCOUNTER — Ambulatory Visit: Admitting: Rehabilitative and Restorative Service Providers"

## 2024-03-06 DIAGNOSIS — M25642 Stiffness of left hand, not elsewhere classified: Secondary | ICD-10-CM

## 2024-03-06 DIAGNOSIS — R278 Other lack of coordination: Secondary | ICD-10-CM

## 2024-03-06 DIAGNOSIS — M79642 Pain in left hand: Secondary | ICD-10-CM | POA: Diagnosis not present

## 2024-03-06 DIAGNOSIS — M6281 Muscle weakness (generalized): Secondary | ICD-10-CM

## 2024-03-06 DIAGNOSIS — R600 Localized edema: Secondary | ICD-10-CM

## 2024-03-08 ENCOUNTER — Ambulatory Visit: Admitting: Orthopedic Surgery

## 2024-03-08 ENCOUNTER — Telehealth: Payer: Self-pay | Admitting: Orthopedic Surgery

## 2024-03-08 NOTE — Telephone Encounter (Signed)
 Pt called wanting to know if she should remove the tape from her hand before her visit at 9:30 this morning. Call back number is [phone].

## 2024-03-08 NOTE — Progress Notes (Unsigned)
" ° °  Tracy Orozco - 64 y.o. female MRN [id number]  Date of birth: [birth year]  Office Visit Note: Visit Date: 03/08/2024 PCP: Tisovec, Richard W, MD Referred by: Vernadine Charlie ORN, MD  Subjective:  HPI: Tracy Orozco is a 64 y.o. female who presents today for follow up 6 weeks status post left thumb CMC arthroplasty .  Pertinent ROS were reviewed with the patient and found to be negative unless otherwise specified above in HPI.   Assessment & Plan: Visit Diagnoses: No diagnosis found.  Plan: ***  Follow-up: No follow-ups on file.   Meds & Orders: No orders of the defined types were placed in this encounter.  No orders of the defined types were placed in this encounter.    Procedures: No procedures performed       Objective:   Vital Signs: There were no vitals taken for this visit.  Ortho Exam ***  Imaging: No results found.   Anshul Afton Alderton, M.D. [facility], Hand Surgery  "

## 2024-03-13 ENCOUNTER — Encounter: Admitting: Rehabilitative and Restorative Service Providers"

## 2024-03-20 ENCOUNTER — Encounter: Admitting: Rehabilitative and Restorative Service Providers"

## 2024-03-21 ENCOUNTER — Encounter: Admitting: Rehabilitative and Restorative Service Providers"

## 2024-03-27 ENCOUNTER — Encounter: Admitting: Rehabilitative and Restorative Service Providers"

## 2024-03-28 ENCOUNTER — Encounter: Admitting: Rehabilitative and Restorative Service Providers"

## 2024-04-03 ENCOUNTER — Encounter: Admitting: Rehabilitative and Restorative Service Providers"

## 2024-04-04 ENCOUNTER — Encounter: Admitting: Rehabilitative and Restorative Service Providers"

## 2024-04-05 ENCOUNTER — Encounter: Admitting: Orthopedic Surgery
